# Patient Record
Sex: Female | Born: 1990 | Race: Black or African American | Hispanic: No | Marital: Single | Smoking: Never smoker
Health system: Southern US, Community
[De-identification: ages and names within clinical notes are randomized; demographics above are authoritative.]

## PROBLEM LIST (undated history)

## (undated) ENCOUNTER — Inpatient Hospital Stay (HOSPITAL_COMMUNITY): Payer: Self-pay

## (undated) DIAGNOSIS — F53 Postpartum depression: Secondary | ICD-10-CM

## (undated) DIAGNOSIS — O26643 Intrahepatic cholestasis of pregnancy, third trimester: Secondary | ICD-10-CM

## (undated) DIAGNOSIS — K831 Obstruction of bile duct: Secondary | ICD-10-CM

## (undated) DIAGNOSIS — O26613 Liver and biliary tract disorders in pregnancy, third trimester: Secondary | ICD-10-CM

## (undated) DIAGNOSIS — O99345 Other mental disorders complicating the puerperium: Secondary | ICD-10-CM

## (undated) HISTORY — DX: Liver and biliary tract disorders in pregnancy, third trimester: K83.1

## (undated) HISTORY — PX: NO PAST SURGERIES: SHX2092

## (undated) HISTORY — DX: Liver and biliary tract disorders in pregnancy, third trimester: O26.613

## (undated) HISTORY — DX: Intrahepatic cholestasis of pregnancy, third trimester: O26.643

---
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
## 2009-08-10 ENCOUNTER — Emergency Department (HOSPITAL_COMMUNITY): Admission: EM | Admit: 2009-08-10 | Discharge: 2009-08-10 | Payer: Self-pay | Admitting: Family Medicine

## 2010-07-08 LAB — URINE MICROSCOPIC-ADD ON

## 2010-07-08 LAB — URINALYSIS, ROUTINE W REFLEX MICROSCOPIC
Hgb urine dipstick: NEGATIVE
Protein, ur: NEGATIVE mg/dL
Specific Gravity, Urine: 1.026 (ref 1.005–1.030)
Urobilinogen, UA: 0.2 mg/dL (ref 0.0–1.0)

## 2010-07-08 LAB — GC/CHLAMYDIA PROBE AMP, GENITAL

## 2010-07-08 LAB — WET PREP, GENITAL
Trich, Wet Prep: NONE SEEN
Yeast Wet Prep HPF POC: NONE SEEN

## 2010-07-08 LAB — URINE CULTURE

## 2010-08-10 ENCOUNTER — Emergency Department (HOSPITAL_COMMUNITY)
Admission: EM | Admit: 2010-08-10 | Discharge: 2010-08-10 | Disposition: A | Discharge status: Home / Self Care | Payer: Self-pay | Attending: Emergency Medicine | Admitting: Emergency Medicine

## 2010-08-10 DIAGNOSIS — N898 Other specified noninflammatory disorders of vagina: Secondary | ICD-10-CM | POA: Insufficient documentation

## 2010-08-10 DIAGNOSIS — R599 Enlarged lymph nodes, unspecified: Secondary | ICD-10-CM | POA: Insufficient documentation

## 2010-08-10 DIAGNOSIS — A64 Unspecified sexually transmitted disease: Secondary | ICD-10-CM | POA: Insufficient documentation

## 2010-08-10 DIAGNOSIS — E669 Obesity, unspecified: Secondary | ICD-10-CM | POA: Insufficient documentation

## 2010-08-10 LAB — WET PREP, GENITAL: Clue Cells Wet Prep HPF POC: NONE SEEN

## 2010-08-10 LAB — URINALYSIS, ROUTINE W REFLEX MICROSCOPIC
Glucose, UA: NEGATIVE mg/dL
Ketones, ur: NEGATIVE mg/dL
Nitrite: NEGATIVE
Protein, ur: NEGATIVE mg/dL
Urobilinogen, UA: 0.2 mg/dL (ref 0.0–1.0)

## 2010-08-10 LAB — URINE MICROSCOPIC-ADD ON

## 2010-08-12 LAB — URINE CULTURE
Colony Count: 85000
Culture  Setup Time: 201205100404

## 2010-12-26 LAB — URINALYSIS, ROUTINE W REFLEX MICROSCOPIC
Bilirubin Urine: NEGATIVE
Glucose, UA: NEGATIVE
Protein, ur: NEGATIVE

## 2010-12-26 LAB — URINE CULTURE

## 2010-12-26 LAB — URINE MICROSCOPIC-ADD ON

## 2010-12-26 LAB — GC/CHLAMYDIA PROBE AMP, GENITAL

## 2010-12-26 LAB — WET PREP, GENITAL: Clue Cells Wet Prep HPF POC: NONE SEEN

## 2011-04-04 NOTE — L&D Delivery Note (Signed)
Patient was C/C/0 and pushed for 20 minutes with epidural.   NSVD  female infant, Apgars 9/9, weight pending.   The patient had one second degree laceration repaired with 2-0 vicryl. Fundus was firm. EBL was expected. Placenta was delivered intact, 2-vessel cord noted, sent to pathology. Vagina was clear.  Baby was vigorous to bedside.  Philip Aspen

## 2011-06-14 ENCOUNTER — Encounter (HOSPITAL_COMMUNITY): Payer: Self-pay | Admitting: Advanced Practice Midwife

## 2011-06-14 ENCOUNTER — Inpatient Hospital Stay (HOSPITAL_COMMUNITY): Payer: Medicaid Other

## 2011-06-14 ENCOUNTER — Inpatient Hospital Stay (HOSPITAL_COMMUNITY)
Admission: AD | Admit: 2011-06-14 | Discharge: 2011-06-14 | Disposition: A | Discharge status: Home / Self Care | Payer: Medicaid Other | Source: Ambulatory Visit | Attending: Obstetrics & Gynecology | Admitting: Obstetrics & Gynecology

## 2011-06-14 DIAGNOSIS — O26859 Spotting complicating pregnancy, unspecified trimester: Secondary | ICD-10-CM | POA: Insufficient documentation

## 2011-06-14 DIAGNOSIS — Z3490 Encounter for supervision of normal pregnancy, unspecified, unspecified trimester: Secondary | ICD-10-CM

## 2011-06-14 DIAGNOSIS — R109 Unspecified abdominal pain: Secondary | ICD-10-CM | POA: Insufficient documentation

## 2011-06-14 DIAGNOSIS — Z8619 Personal history of other infectious and parasitic diseases: Secondary | ICD-10-CM | POA: Clinically undetermined

## 2011-06-14 DIAGNOSIS — Z1389 Encounter for screening for other disorder: Secondary | ICD-10-CM

## 2011-06-14 MED ORDER — IBUPROFEN 800 MG PO TABS
800.0000 mg | ORAL_TABLET | Freq: Once | ORAL | Status: AC
  Administered 2011-06-14: 800 mg via ORAL
  Filled 2011-06-14: qty 1

## 2011-06-14 NOTE — Discharge Instructions (Signed)
ABCs of Pregnancy A Antepartum care is very important. Be sure you see your doctor and get prenatal care as soon as you think you are pregnant. At this time, you will be tested for infection, genetic abnormalities and potential problems with you and the pregnancy. This is the time to discuss diet, exercise, work, medications, labor, pain medication during labor and the possibility of a cesarean delivery. Ask any questions that may concern you. It is important to see your doctor regularly throughout your pregnancy. Avoid exposure to toxic substances and chemicals - such as cleaning solvents, lead and mercury, some insecticides, and paint. Pregnant women should avoid exposure to paint fumes, and fumes that cause you to feel ill, dizzy or faint. When possible, it is a good idea to have a pre-pregnancy consultation with your caregiver to begin some important recommendations your caregiver suggests such as, taking folic acid, exercising, quitting smoking, avoiding alcoholic beverages, etc. B Breastfeeding is the healthiest choice for both you and your baby. It has many nutritional benefits for the baby and health benefits for the mother. It also creates a very tight and loving bond between the baby and mother. Talk to your doctor, your family and friends, and your employer about how you choose to feed your baby and how they can support you in your decision. Not all birth defects can be prevented, but a woman can take actions that may increase her chance of having a healthy baby. Many birth defects happen very early in pregnancy, sometimes before a woman even knows she is pregnant. Birth defects or abnormalities of any child in your or the father's family should be discussed with your caregiver. Get a good support bra as your breast size changes. Wear it especially when you exercise and when nursing.  C Celebrate the news of your pregnancy with the your spouse/father and family. Childbirth classes are helpful to  take for you and the spouse/father because it helps to understand what happens during the pregnancy, labor and delivery. Cesarean delivery should be discussed with your doctor so you are prepared for that possibility. The pros and cons of circumcision if it is a boy, should be discussed with your pediatrician. Cigarette smoking during pregnancy can result in low birth weight babies. It has been associated with infertility, miscarriages, tubal pregnancies, infant death (mortality) and poor health (morbidity) in childhood. Additionally, cigarette smoking may cause long-term learning disabilities. If you smoke, you should try to quit before getting pregnant and not smoke during the pregnancy. Secondary smoke may also harm a mother and her developing baby. It is a good idea to ask people to stop smoking around you during your pregnancy and after the baby is born. Extra calcium is necessary when you are pregnant and is found in your prenatal vitamin, in dairy products, green leafy vegetables and in calcium supplements. D A healthy diet according to your current weight and height, along with vitamins and mineral supplements should be discussed with your caregiver. Domestic abuse or violence should be made known to your doctor right away to get the situation corrected. Drink more water when you exercise to keep hydrated. Discomfort of your back and legs usually develops and progresses from the middle of the second trimester through to delivery of the baby. This is because of the enlarging baby and uterus, which may also affect your balance. Do not take illegal drugs. Illegal drugs can seriously harm the baby and you. Drink extra fluids (water is best) throughout pregnancy to help   your body keep up with the increases in your blood volume. Drink at least 6 to 8 glasses of water, fruit juice, or milk each day. A good way to know you are drinking enough fluid is when your urine looks almost like clear water or is very light  yellow.  E Eat healthy to get the nutrients you and your unborn baby need. Your meals should include the five basic food groups. Exercise (30 minutes of light to moderate exercise a day) is important and encouraged during pregnancy, if there are no medical problems or problems with the pregnancy. Exercise that causes discomfort or dizziness should be stopped and reported to your caregiver. Emotions during pregnancy can change from being ecstatic to depression and should be understood by you, your partner and your family. F Fetal screening with ultrasound, amniocentesis and monitoring during pregnancy and labor is common and sometimes necessary. Take 400 micrograms of folic acid daily both before, when possible, and during the first few months of pregnancy to reduce the risk of birth defects of the brain and spine. All women who could possibly become pregnant should take a vitamin with folic acid, every day. It is also important to eat a healthy diet with fortified foods (enriched grain products, including cereals, rice, breads, and pastas) and foods with natural sources of folate (orange juice, green leafy vegetables, beans, peanuts, broccoli, asparagus, peas, and lentils). The father should be involved with all aspects of the pregnancy including, the prenatal care, childbirth classes, labor, delivery, and postpartum time. Fathers may also have emotional concerns about being a father, financial needs, and raising a family. G Genetic testing should be done appropriately. It is important to know your family and the father's history. If there have been problems with pregnancies or birth defects in your family, report these to your doctor. Also, genetic counselors can talk with you about the information you might need in making decisions about having a family. You can call a major medical center in your area for help in finding a board-certified genetic counselor. Genetic testing and counseling should be done  before pregnancy when possible, especially if there is a history of problems in the mother's or father's family. Certain ethnic backgrounds are more at risk for genetic defects. H Get familiar with the hospital where you will be having your baby. Get to know how long it takes to get there, the labor and delivery area, and the hospital procedures. Be sure your medical insurance is accepted there. Get your home ready for the baby including, clothes, the baby's room (when possible), furniture and car seat. Hand washing is important throughout the day, especially after handling raw meat and poultry, changing the baby's diaper or using the bathroom. This can help prevent the spread of many bacteria and viruses that cause infection. Your hair may become dry and thinner, but will return to normal a few weeks after the baby is born. Heartburn is a common problem that can be treated by taking antacids recommended by your caregiver, eating smaller meals 5 or 6 times a day, not drinking liquids when eating, drinking between meals and raising the head of your bed 2 to 3 inches. I Insurance to cover you, the baby, doctor and hospital should be reviewed so that you will be prepared to pay any costs not covered by your insurance plan. If you do not have medical insurance, there are usually clinics and services available for you in your community. Take 30 milligrams of iron during   your pregnancy as prescribed by your doctor to reduce the risk of low red blood cells (anemia) later in pregnancy. All women of childbearing age should eat a diet rich in iron. J There should be a joint effort for the mother, father and any other children to adapt to the pregnancy financially, emotionally, and psychologically during the pregnancy. Join a support group for moms-to-be. Or, join a class on parenting or childbirth. Have the family participate when possible. K Know your limits. Let your caregiver know if you experience any of the  following:   Pain of any kind.   Strong cramps.   You develop a lot of weight in a short period of time (5 pounds in 3 to 5 days).   Vaginal bleeding, leaking of amniotic fluid.   Headache, vision problems.   Dizziness, fainting, shortness of breath.   Chest pain.   Fever of 102 F (38.9 C) or higher.   Gush of clear fluid from your vagina.   Painful urination.   Domestic violence.   Irregular heartbeat (palpitations).   Rapid beating of the heart (tachycardia).   Constant feeling sick to your stomach (nauseous) and vomiting.   Trouble walking, fluid retention (edema).   Muscle weakness.   If your baby has decreased activity.   Persistent diarrhea.   Abnormal vaginal discharge.   Uterine contractions at 20-minute intervals.   Back pain that travels down your leg.  L Learn and practice that what you eat and drink should be in moderation and healthy for you and your baby. Legal drugs such as alcohol and caffeine are important issues for pregnant women. There is no safe amount of alcohol a woman can drink while pregnant. Fetal alcohol syndrome, a disorder characterized by growth retardation, facial abnormalities, and central nervous system dysfunction, is caused by a woman's use of alcohol during pregnancy. Caffeine, found in tea, coffee, soft drinks and chocolate, should also be limited. Be sure to read labels when trying to cut down on caffeine during pregnancy. More than 200 foods, beverages, and over-the-counter medications contain caffeine and have a high salt content! There are coffees and teas that do not contain caffeine. M Medical conditions such as diabetes, epilepsy, and high blood pressure should be treated and kept under control before pregnancy when possible, but especially during pregnancy. Ask your caregiver about any medications that may need to be changed or adjusted during pregnancy. If you are currently taking any medications, ask your caregiver if it  is safe to take them while you are pregnant or before getting pregnant when possible. Also, be sure to discuss any herbs or vitamins you are taking. They are medicines, too! Discuss with your doctor all medications, prescribed and over-the-counter, that you are taking. During your prenatal visit, discuss the medications your doctor may give you during labor and delivery. N Never be afraid to ask your doctor or caregiver questions about your health, the progress of the pregnancy, family problems, stressful situations, and recommendation for a pediatrician, if you do not have one. It is better to take all precautions and discuss any questions or concerns you may have during your office visits. It is a good idea to write down your questions before you visit the doctor. O Over-the-counter cough and cold remedies may contain alcohol or other ingredients that should be avoided during pregnancy. Ask your caregiver about prescription, herbs or over-the-counter medications that you are taking or may consider taking while pregnant.  P Physical activity during pregnancy can   benefit both you and your baby by lessening discomfort and fatigue, providing a sense of well-being, and increasing the likelihood of early recovery after delivery. Light to moderate exercise during pregnancy strengthens the belly (abdominal) and back muscles. This helps improve posture. Practicing yoga, walking, swimming, and cycling on a stationary bicycle are usually safe exercises for pregnant women. Avoid scuba diving, exercise at high altitudes (over 3000 feet), skiing, horseback riding, contact sports, etc. Always check with your doctor before beginning any kind of exercise, especially during pregnancy and especially if you did not exercise before getting pregnant. Q Queasiness, stomach upset and morning sickness are common during pregnancy. Eating a couple of crackers or dry toast before getting out of bed. Foods that you normally love may  make you feel sick to your stomach. You may need to substitute other nutritious foods. Eating 5 or 6 small meals a day instead of 3 large ones may make you feel better. Do not drink with your meals, drink between meals. Questions that you have should be written down and asked during your prenatal visits. R Read about and make plans to baby-proof your home. There are important tips for making your home a safer environment for your baby. Review the tips and make your home safer for you and your baby. Read food labels regarding calories, salt and fat content in the food. S Saunas, hot tubs, and steam rooms should be avoided while you are pregnant. Excessive high heat may be harmful during your pregnancy. Your caregiver will screen and examine you for sexually transmitted diseases and genetic disorders during your prenatal visits. Learn the signs of labor. Sexual relations while pregnant is safe unless there is a medical or pregnancy problem and your caregiver advises against it. T Traveling long distances should be avoided especially in the third trimester of your pregnancy. If you do have to travel out of state, be sure to take a copy of your medical records and medical insurance plan with you. You should not travel long distances without seeing your doctor first. Most airlines will not allow you to travel after 36 weeks of pregnancy. Toxoplasmosis is an infection caused by a parasite that can seriously harm an unborn baby. Avoid eating undercooked meat and handling cat litter. Be sure to wear gloves when gardening. Tingling of the hands and fingers is not unusual and is due to fluid retention. This will go away after the baby is born. U Womb (uterus) size increases during the first trimester. Your kidneys will begin to function more efficiently. This may cause you to feel the need to urinate more often. You may also leak urine when sneezing, coughing or laughing. This is due to the growing uterus pressing  against your bladder, which lies directly in front of and slightly under the uterus during the first few months of pregnancy. If you experience burning along with frequency of urination or bloody urine, be sure to tell your doctor. The size of your uterus in the third trimester may cause a problem with your balance. It is advisable to maintain good posture and avoid wearing high heels during this time. An ultrasound of your baby may be necessary during your pregnancy and is safe for you and your baby. V Vaccinations are an important concern for pregnant women. Get needed vaccines before pregnancy. Center for Disease Control (www.cdc.gov) has clear guidelines for the use of vaccines during pregnancy. Review the list, be sure to discuss it with your doctor. Prenatal vitamins are helpful   and healthy for you and the baby. Do not take extra vitamins except what is recommended. Taking too much of certain vitamins can cause overdose problems. Continuous vomiting should be reported to your caregiver. Varicose veins may appear especially if there is a family history of varicose veins. They should subside after the delivery of the baby. Support hose helps if there is leg discomfort. W Being overweight or underweight during pregnancy may cause problems. Try to get within 15 pounds of your ideal weight before pregnancy. Remember, pregnancy is not a time to be dieting! Do not stop eating or start skipping meals as your weight increases. Both you and your baby need the calories and nutrition you receive from a healthy diet. Be sure to consult with your doctor about your diet. There is a formula and diet plan available depending on whether you are overweight or underweight. Your caregiver or nutritionist can help and advise you if necessary. X Avoid X-rays. If you must have dental work or diagnostic tests, tell your dentist or physician that you are pregnant so that extra care can be taken. X-rays should only be taken when  the risks of not taking them outweigh the risk of taking them. If needed, only the minimum amount of radiation should be used. When X-rays are necessary, protective lead shields should be used to cover areas of the body that are not being X-rayed. Y Your baby loves you. Breastfeeding your baby creates a loving and very close bond between the two of you. Give your baby a healthy environment to live in while you are pregnant. Infants and children require constant care and guidance. Their health and safety should be carefully watched at all times. After the baby is born, rest or take a nap when the baby is sleeping. Z Get your ZZZs. Be sure to get plenty of rest. Resting on your side as often as possible, especially on your left side is advised. It provides the best circulation to your baby and helps reduce swelling. Try taking a nap for 30 to 45 minutes in the afternoon when possible. After the baby is born rest or take a nap when the baby is sleeping. Try elevating your feet for that amount of time when possible. It helps the circulation in your legs and helps reduce swelling.  Most information courtesy of the CDC. Document Released: 03/20/2005 Document Revised: 03/09/2011 Document Reviewed: 12/02/2008 [employer] Patient Information 2012 [employer]. 

## 2011-06-14 NOTE — MAU Provider Note (Signed)
  History     CSN: [id number]  Arrival date and time: 06/14/11 1521   First Provider Initiated Contact with Patient 06/14/11 1616  (seen by me for bedside US at 1600)    Chief Complaint  Patient presents with  . Vaginal Bleeding   HPI This is a 21 y.o. at 12+ weeks gestation who presents with c/o cramping and bleeding/spotting.  Just had Cultures and wet prep 2 weeks ago and was treated for Chlamydia.   OB History    Grav Para Term Preterm Abortions TAB SAB Ect Mult Living   1               Past Medical History  Diagnosis Date  . No pertinent past medical history     Past Surgical History  Procedure Date  . No past surgeries     Family History  Problem Relation Age of Onset  . Diabetes Maternal Uncle     History  Substance Use Topics  . Smoking status: Never Smoker   . Smokeless tobacco: Not on file  . Alcohol Use: No    Allergies: Not on File  No prescriptions prior to admission    ROS As above  Physical Exam   Blood pressure 129/69, pulse 88, temperature 97.9 F (36.6 C), temperature source Oral, resp. rate 20, height 5\' 4"  (1.626 m), weight 229 lb (103.874 kg), last menstrual period 03/18/2011, SpO2 100.00%.  Physical Exam  Constitutional: She is oriented to person, place, and time. She appears well-developed and well-nourished.  HENT:  Head: Normocephalic.  Cardiovascular: Normal rate.   Respiratory: Effort normal.  GI: Soft. She exhibits no distension and no mass. There is no tenderness. There is no rebound and no guarding.  Genitourinary: Vagina normal and uterus normal. No vaginal discharge found.       No blood at all seen. Scant white d/c. Uterus s=dates.  Musculoskeletal: Normal range of motion.  Neurological: She is alert and oriented to person, place, and time.  Skin: Skin is warm and dry.  Psychiatric: She has a normal mood and affect.   [place]:  Normal SIUP at 12.4 weeks with [facility] 12/23/11  MAU Course  Procedures  MDM Got [place] to  verify absence of bleeding issue and will defer cultures due to recent testing/treatment. Ibuprofen given for cramping. Felt better afterward.  Assessment and Plan  A:  SIUP at 12.4 weeks      No evidence of bleeding P:  Resume Prenatal care  [facility] 06/14/2011, 4:16 PM

## 2011-06-14 NOTE — Progress Notes (Signed)
Pt tearful on vaginal exam. Pt states she feel cramping

## 2011-06-14 NOTE — MAU Note (Signed)
Patient states she has had a pregnancy test at [facility]. Has had spotting last night and a little heavier today. Is wearing a tampon and unable to assess the bleeding. States she is having mild cramping.

## 2011-06-15 NOTE — MAU Provider Note (Signed)
Attestation of Attending Supervision of Advanced Practitioner: Evaluation and management procedures were performed by the PA/NP/CNM/OB Fellow under my supervision/collaboration. Chart reviewed, and agree with management and plan.  Tess Potts, M.D. 06/15/2011 9:06 AM   

## 2011-07-25 ENCOUNTER — Other Ambulatory Visit: Payer: Self-pay

## 2011-07-25 LAB — OB RESULTS CONSOLE ABO/RH: RH Type: POSITIVE

## 2011-07-25 LAB — OB RESULTS CONSOLE HIV ANTIBODY (ROUTINE TESTING): HIV: NONREACTIVE

## 2011-08-15 ENCOUNTER — Other Ambulatory Visit (HOSPITAL_COMMUNITY): Payer: Self-pay | Admitting: Obstetrics and Gynecology

## 2011-08-15 DIAGNOSIS — Z3689 Encounter for other specified antenatal screening: Secondary | ICD-10-CM

## 2011-08-15 DIAGNOSIS — O09899 Supervision of other high risk pregnancies, unspecified trimester: Secondary | ICD-10-CM

## 2011-08-16 ENCOUNTER — Encounter (HOSPITAL_COMMUNITY): Payer: Self-pay

## 2011-08-16 ENCOUNTER — Ambulatory Visit (HOSPITAL_COMMUNITY)
Admission: RE | Admit: 2011-08-16 | Discharge: 2011-08-16 | Disposition: A | Discharge status: Home / Self Care | Payer: Medicaid Other | Source: Ambulatory Visit | Attending: Obstetrics and Gynecology | Admitting: Obstetrics and Gynecology

## 2011-08-16 DIAGNOSIS — Z363 Encounter for antenatal screening for malformations: Secondary | ICD-10-CM | POA: Insufficient documentation

## 2011-08-16 DIAGNOSIS — Z3689 Encounter for other specified antenatal screening: Secondary | ICD-10-CM

## 2011-08-16 DIAGNOSIS — IMO0001 Reserved for inherently not codable concepts without codable children: Secondary | ICD-10-CM | POA: Insufficient documentation

## 2011-08-16 DIAGNOSIS — O358XX0 Maternal care for other (suspected) fetal abnormality and damage, not applicable or unspecified: Secondary | ICD-10-CM | POA: Insufficient documentation

## 2011-08-16 DIAGNOSIS — O09899 Supervision of other high risk pregnancies, unspecified trimester: Secondary | ICD-10-CM

## 2011-08-16 DIAGNOSIS — Z1389 Encounter for screening for other disorder: Secondary | ICD-10-CM | POA: Insufficient documentation

## 2011-09-29 ENCOUNTER — Encounter (HOSPITAL_COMMUNITY): Payer: Self-pay | Admitting: *Deleted

## 2011-09-29 ENCOUNTER — Inpatient Hospital Stay (HOSPITAL_COMMUNITY)
Admission: AD | Admit: 2011-09-29 | Discharge: 2011-09-29 | Disposition: A | Discharge status: Home / Self Care | Payer: Medicaid Other | Source: Ambulatory Visit | Attending: Obstetrics & Gynecology | Admitting: Obstetrics & Gynecology

## 2011-09-29 DIAGNOSIS — N898 Other specified noninflammatory disorders of vagina: Secondary | ICD-10-CM

## 2011-09-29 DIAGNOSIS — N949 Unspecified condition associated with female genital organs and menstrual cycle: Secondary | ICD-10-CM | POA: Insufficient documentation

## 2011-09-29 DIAGNOSIS — O99891 Other specified diseases and conditions complicating pregnancy: Secondary | ICD-10-CM | POA: Insufficient documentation

## 2011-09-29 LAB — WET PREP, GENITAL
Clue Cells Wet Prep HPF POC: NONE SEEN
Trich, Wet Prep: NONE SEEN

## 2011-09-29 NOTE — Discharge Instructions (Signed)

## 2011-09-29 NOTE — MAU Note (Signed)
Had gush of fluid around 0600 and 1000, fluid clear/whiteish fluid. Patient cramping over last week and bleeding noted on Tuesday, since resolved. Reports positive fetal movement

## 2011-09-29 NOTE — MAU Note (Signed)
Patient states she had a gush of clear fluid at 0600 and again at 1045. No active leaking at this time. Has been having lower abdominal pain for about one week off and on. No bleeding today but had some spotting on 6-25. Reports good fetal movement.

## 2011-09-29 NOTE — MAU Provider Note (Signed)
History     CSN: [id number]  Arrival date and time: 09/29/11 1756   First Provider Initiated Contact with Patient 09/29/11 1906      Chief Complaint  Patient presents with  . Rupture of Membranes   HPI This is a 21 yo G1 at 27.6 weeks seen for whitish vaginal discharge that occurred this morning at work.  She felt a big gush of fluid that continued to trickle down her leg.  Denies decreased fetal activity, vaginal bleeding, contractions.    OB History    Grav Para Term Preterm Abortions TAB SAB Ect Mult Living   1               Past Medical History  Diagnosis Date  . No pertinent past medical history     Past Surgical History  Procedure Date  . No past surgeries     Family History  Problem Relation Age of Onset  . Diabetes Maternal Uncle     History  Substance Use Topics  . Smoking status: Never Smoker   . Smokeless tobacco: Not on file  . Alcohol Use: Yes     occasional ETOH use prior to pregnancy    Allergies: No Known Allergies  Prescriptions prior to admission  Medication Sig Dispense Refill  . acetaminophen (TYLENOL) 500 MG tablet Take 1,000 mg by mouth every 6 (six) hours as needed. For headache or pain      . Prenatal Vit-Fe Fumarate-FA (PRENATAL MULTIVITAMIN) TABS Take 1 tablet by mouth daily.        ROS Physical Exam   Blood pressure 90/58, pulse 88, temperature 99.6 F (37.6 C), temperature source Oral, resp. rate 18, height 5' 5.5" (1.664 m), weight 110.678 kg (244 lb), last menstrual period 03/18/2011, SpO2 100.00%.  Physical Exam  Constitutional: She is oriented to person, place, and time. She appears well-developed and well-nourished.  GI: Soft. She exhibits no distension and no mass. There is no tenderness. There is no rebound and no guarding.  Genitourinary:       Normal external genitalia and vaginal mucosa.  White vaginal discharge.  No pooling.  No fluid from os with valsalva.  Neurological: She is alert and oriented to person,  place, and time.  Skin: Skin is warm and dry.  Psychiatric: She has a normal mood and affect. Her behavior is normal. Judgment and thought content normal.   Fern: neg Results for orders placed during the hospital encounter of 09/29/11 (from the past 24 hour(s))  WET PREP, GENITAL     Status: Abnormal   Collection Time   09/29/11  7:10 PM      Component Value Range   Yeast Wet Prep HPF POC NONE SEEN  NONE SEEN   Trich, Wet Prep NONE SEEN  NONE SEEN   Clue Cells Wet Prep HPF POC NONE SEEN  NONE SEEN   WBC, Wet Prep HPF POC FEW (*) NONE SEEN  AMNISURE RUPTURE OF MEMBRANE (ROM)     Status: Normal   Collection Time   09/29/11  7:10 PM      Component Value Range   Amnisure ROM NEGATIVE       MAU Course  Procedures NST: category 1 tracing - appropriate tracing for gestational age.  MDM Pooling, ferning, amnisure negative.  No BV, yeast, or trich.   Assessment and Plan  1.  Vag discharge  Will discharge to home as no evidence on ROM or vaginal infection.  Patient to return if has more  fluid loss.  Lilburn Straw JEHIEL 09/29/2011, 7:21 PM

## 2011-09-30 LAB — GC/CHLAMYDIA PROBE AMP, GENITAL: GC Probe Amp, Genital: NEGATIVE

## 2011-11-04 LAB — OB RESULTS CONSOLE GBS: GBS: NEGATIVE

## 2011-11-08 ENCOUNTER — Inpatient Hospital Stay (HOSPITAL_COMMUNITY): Payer: Medicaid Other

## 2011-11-08 ENCOUNTER — Inpatient Hospital Stay (HOSPITAL_COMMUNITY)
Admission: AD | Admit: 2011-11-08 | Discharge: 2011-11-08 | Disposition: A | Discharge status: Home / Self Care | Payer: Medicaid Other | Source: Ambulatory Visit | Attending: Obstetrics and Gynecology | Admitting: Obstetrics and Gynecology

## 2011-11-08 ENCOUNTER — Encounter (HOSPITAL_COMMUNITY): Payer: Self-pay | Admitting: *Deleted

## 2011-11-08 DIAGNOSIS — O368190 Decreased fetal movements, unspecified trimester, not applicable or unspecified: Secondary | ICD-10-CM | POA: Insufficient documentation

## 2011-11-08 NOTE — MAU Note (Signed)
Pt presents for decreased fetal movement today.  She states waking up with movement at 0100 this morning, but has not felt anything since then.

## 2011-11-08 NOTE — MAU Note (Signed)
Pt states for past week she's had decreased fm,  Only feels good mvmt when she drinks something very sweet. Denies bleeding, notes vaginal discharge that looks like "egg whites".

## 2011-12-01 ENCOUNTER — Observation Stay (HOSPITAL_COMMUNITY): Payer: Medicaid Other

## 2011-12-01 ENCOUNTER — Encounter (HOSPITAL_COMMUNITY): Payer: Self-pay | Admitting: *Deleted

## 2011-12-01 ENCOUNTER — Observation Stay (HOSPITAL_COMMUNITY)
Admission: AD | Admit: 2011-12-01 | Discharge: 2011-12-01 | Disposition: A | Discharge status: Home / Self Care | Payer: Medicaid Other | Source: Ambulatory Visit | Attending: Obstetrics and Gynecology | Admitting: Obstetrics and Gynecology

## 2011-12-01 ENCOUNTER — Inpatient Hospital Stay (HOSPITAL_COMMUNITY): Payer: Medicaid Other

## 2011-12-01 DIAGNOSIS — O4700 False labor before 37 completed weeks of gestation, unspecified trimester: Principal | ICD-10-CM | POA: Insufficient documentation

## 2011-12-01 DIAGNOSIS — O368390 Maternal care for abnormalities of the fetal heart rate or rhythm, unspecified trimester, not applicable or unspecified: Secondary | ICD-10-CM | POA: Insufficient documentation

## 2011-12-01 DIAGNOSIS — O2390 Unspecified genitourinary tract infection in pregnancy, unspecified trimester: Secondary | ICD-10-CM | POA: Insufficient documentation

## 2011-12-01 DIAGNOSIS — Z3490 Encounter for supervision of normal pregnancy, unspecified, unspecified trimester: Secondary | ICD-10-CM

## 2011-12-01 DIAGNOSIS — N390 Urinary tract infection, site not specified: Secondary | ICD-10-CM | POA: Insufficient documentation

## 2011-12-01 LAB — URINALYSIS, ROUTINE W REFLEX MICROSCOPIC
Glucose, UA: NEGATIVE mg/dL
Ketones, ur: NEGATIVE mg/dL
Protein, ur: NEGATIVE mg/dL
Urobilinogen, UA: 0.2 mg/dL (ref 0.0–1.0)

## 2011-12-01 LAB — CBC
MCH: 31.1 pg (ref 26.0–34.0)
MCHC: 33.8 g/dL (ref 30.0–36.0)
Platelets: 216 10*3/uL (ref 150–400)

## 2011-12-01 LAB — URINE MICROSCOPIC-ADD ON

## 2011-12-01 LAB — TYPE AND SCREEN
ABO/RH(D): O POS
Antibody Screen: NEGATIVE

## 2011-12-01 LAB — ABO/RH: ABO/RH(D): O POS

## 2011-12-01 MED ORDER — PRENATAL MULTIVITAMIN CH
1.0000 | ORAL_TABLET | Freq: Every day | ORAL | Status: DC
  Administered 2011-12-01: 1 via ORAL
  Filled 2011-12-01: qty 1

## 2011-12-01 MED ORDER — ZOLPIDEM TARTRATE 5 MG PO TABS: 5.0000 mg | ORAL_TABLET | Freq: Every evening | ORAL | Status: DC | PRN

## 2011-12-01 MED ORDER — ACETAMINOPHEN 325 MG PO TABS: 650.0000 mg | ORAL_TABLET | ORAL | Status: DC | PRN

## 2011-12-01 MED ORDER — DOCUSATE SODIUM 100 MG PO CAPS
100.0000 mg | ORAL_CAPSULE | Freq: Every day | ORAL | Status: DC
  Administered 2011-12-01: 100 mg via ORAL
  Filled 2011-12-01: qty 1

## 2011-12-01 MED ORDER — CALCIUM CARBONATE ANTACID 500 MG PO CHEW: 2.0000 | CHEWABLE_TABLET | ORAL | Status: DC | PRN

## 2011-12-01 MED ORDER — NITROFURANTOIN MONOHYD MACRO 100 MG PO CAPS: 100.0000 mg | ORAL_CAPSULE | Freq: Two times a day (BID) | ORAL | Status: AC

## 2011-12-01 MED ORDER — NITROFURANTOIN MONOHYD MACRO 100 MG PO CAPS
100.0000 mg | ORAL_CAPSULE | Freq: Two times a day (BID) | ORAL | Status: DC
  Administered 2011-12-01: 100 mg via ORAL
  Filled 2011-12-01 (×3): qty 1

## 2011-12-01 MED ORDER — OXYCODONE-ACETAMINOPHEN 5-325 MG PO TABS: 2.0000 | ORAL_TABLET | Freq: Once | ORAL | Status: DC
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
## 2011-12-01 NOTE — Progress Notes (Signed)
21 y.o. G1P0 [redacted]w[redacted]d HD#0 admitted for 36 wks, ctxs, BPP 6/10 at 0330 this am.  Pt currently stable with no c/o lof or bleeding.  Good FM.  Filed Vitals:   12/01/11 0050 12/01/11 0442 12/01/11 0651 12/01/11 0748  BP: 122/68 118/65  110/62  Pulse:  82  93  Temp: 97.9 F (36.6 C) 97.8 F (36.6 C)  97.8 F (36.6 C)  TempSrc: Oral Oral  Oral  Resp: 20 20 18 20   Height: 5\' 5"  (1.651 m)     Weight: 113.626 kg (250 lb 8 oz)       Lungs CTA Cor RRR Abd  Soft, gravid, nontender Ex SCDs FHTs  120s, good short term variability, NST R Toco  occ  Results for orders placed during the hospital encounter of 12/01/11 (from the past 24 hour(s))  URINALYSIS, ROUTINE W REFLEX MICROSCOPIC     Status: Abnormal   Collection Time   12/01/11  1:50 AM      Component Value Range   Color, Urine YELLOW  YELLOW   APPearance CLEAR  CLEAR   Specific Gravity, Urine 1.010  1.005 - 1.030   pH 7.0  5.0 - 8.0   Glucose, UA NEGATIVE  NEGATIVE mg/dL   Hgb urine dipstick NEGATIVE  NEGATIVE   Bilirubin Urine NEGATIVE  NEGATIVE   Ketones, ur NEGATIVE  NEGATIVE mg/dL   Protein, ur NEGATIVE  NEGATIVE mg/dL   Urobilinogen, UA 0.2  0.0 - 1.0 mg/dL   Nitrite NEGATIVE  NEGATIVE   Leukocytes, UA MODERATE (*) NEGATIVE  URINE MICROSCOPIC-ADD ON     Status: Abnormal   Collection Time   12/01/11  1:50 AM      Component Value Range   Squamous Epithelial / LPF MANY (*) RARE   WBC, UA 7-10  <3 WBC/hpf   Bacteria, UA RARE  RARE   Urine-Other MUCOUS PRESENT    TYPE AND SCREEN     Status: Normal   Collection Time   12/01/11  5:05 AM      Component Value Range   ABO/RH(D) O POS     Antibody Screen NEG     Sample Expiration 12/04/2011    CBC     Status: Abnormal   Collection Time   12/01/11  5:05 AM      Component Value Range   WBC 7.4  4.0 - 10.5 K/uL   RBC 3.83 (*) 3.87 - 5.11 MIL/uL   Hemoglobin 11.9 (*) 12.0 - 15.0 g/dL   HCT 16.1 (*) 09.6 - 04.5 %   MCV 91.9  78.0 - 100.0 fL   MCH 31.1  26.0 - 34.0 pg   MCHC  33.8  30.0 - 36.0 g/dL   RDW 40.9  81.1 - 91.4 %   Platelets 216  150 - 400 K/uL  ABO/RH     Status: Normal   Collection Time   12/01/11  5:05 AM      Component Value Range   ABO/RH(D) O POS      A:  HD#0  [redacted]w[redacted]d with BPP 6/10.  FHTs now more reassuring.  AFI was 22 cm.  P: If BPP 8/10 today and no other signs of labor, will d/c to home with kick counts.  UTI- Rx with macrobid.  Kristin [name] A

## 2011-12-01 NOTE — H&P (Signed)
  S: Pt was seen in MAU for r/o labor.  While in MAU fetal tracing was noted to be reassuring but not reactive.  A few variables were also noted.  The patients pregnancy has been complicated by [facility].  Given these findings a BPP was performed. BPP was 6/8, -2 for non-sustained fetal breathing movements.  The patient was admitted for obs due to 6/8 BPP. O: AFVSS FHT 140 reassiring cvx 1/th/-2 toco irregular A/P: 21 yo G1P0 @ 36+6 with 6/8 BPP 1) Admit for obs 2) Continuous monitoring 3) repeat BPP later this morning. If BPP 8/8 then D/C home

## 2011-12-01 NOTE — Progress Notes (Signed)
UR Chart review completed.  

## 2011-12-01 NOTE — Progress Notes (Signed)
Dr. Tenny Craw notified of pt status. Plan of care d/w Dr. Tenny Craw. UA sent . Pt to be rechecked in one hour. If no cervical change, and UA ins WNL pt may be discharged home w/ 2 percocet for her back pain.

## 2011-12-01 NOTE — MAU Note (Signed)
Pt reports pain in back since 2300

## 2011-12-19 ENCOUNTER — Encounter (HOSPITAL_COMMUNITY): Payer: Self-pay | Admitting: Anesthesiology

## 2011-12-19 ENCOUNTER — Inpatient Hospital Stay (HOSPITAL_COMMUNITY): Payer: Medicaid Other | Admitting: Anesthesiology

## 2011-12-19 ENCOUNTER — Inpatient Hospital Stay (HOSPITAL_COMMUNITY)
Admission: RE | Admit: 2011-12-19 | Discharge: 2011-12-21 | DRG: 775 | Disposition: A | Discharge status: Home / Self Care | Payer: Medicaid Other | Source: Ambulatory Visit | Attending: Obstetrics and Gynecology | Admitting: Obstetrics and Gynecology

## 2011-12-19 ENCOUNTER — Encounter (HOSPITAL_COMMUNITY): Payer: Self-pay

## 2011-12-19 LAB — CBC
MCH: 30.9 pg (ref 26.0–34.0)
MCHC: 33.9 g/dL (ref 30.0–36.0)
Platelets: 238 10*3/uL (ref 150–400)
RDW: 14.2 % (ref 11.5–15.5)

## 2011-12-19 LAB — RPR: RPR Ser Ql: NONREACTIVE

## 2011-12-19 LAB — TYPE AND SCREEN
ABO/RH(D): O POS
Antibody Screen: NEGATIVE

## 2011-12-19 MED ORDER — LACTATED RINGERS IV SOLN
500.0000 mL | INTRAVENOUS | Status: DC | PRN
Start: 2011-12-19 — End: 2011-12-19

## 2011-12-19 MED ORDER — LANOLIN HYDROUS EX OINT: TOPICAL_OINTMENT | CUTANEOUS | Status: DC | PRN

## 2011-12-19 MED ORDER — PRENATAL MULTIVITAMIN CH: 1.0000 | ORAL_TABLET | Freq: Every day | ORAL | Status: DC

## 2011-12-19 MED ORDER — TETANUS-DIPHTH-ACELL PERTUSSIS 5-2.5-18.5 LF-MCG/0.5 IM SUSP
0.5000 mL | Freq: Once | INTRAMUSCULAR | Status: AC
  Administered 2011-12-20: 0.5 mL via INTRAMUSCULAR

## 2011-12-19 MED ORDER — SIMETHICONE 80 MG PO CHEW: 80.0000 mg | CHEWABLE_TABLET | ORAL | Status: DC | PRN

## 2011-12-19 MED ORDER — OXYTOCIN 40 UNITS IN LACTATED RINGERS INFUSION - SIMPLE MED
1.0000 m[IU]/min | INTRAVENOUS | Status: DC
  Administered 2011-12-19: 2 m[IU]/min via INTRAVENOUS
  Filled 2011-12-19: qty 1000

## 2011-12-19 MED ORDER — ZOLPIDEM TARTRATE 5 MG PO TABS: 5.0000 mg | ORAL_TABLET | Freq: Every evening | ORAL | Status: DC | PRN

## 2011-12-19 MED ORDER — FENTANYL 2.5 MCG/ML BUPIVACAINE 1/10 % EPIDURAL INFUSION (WH - ANES)
14.0000 mL/h | INTRAMUSCULAR | Status: DC
  Administered 2011-12-19: 14 mL/h via EPIDURAL
  Filled 2011-12-19 (×2): qty 60

## 2011-12-19 MED ORDER — BENZOCAINE-MENTHOL 20-0.5 % EX AERO
1.0000 "application " | INHALATION_SPRAY | CUTANEOUS | Status: DC | PRN
  Administered 2011-12-20: 1 via TOPICAL
  Filled 2011-12-19: qty 56

## 2011-12-19 MED ORDER — ONDANSETRON HCL 4 MG/2ML IJ SOLN: 4.0000 mg | INTRAMUSCULAR | Status: DC | PRN

## 2011-12-19 MED ORDER — DIBUCAINE 1 % RE OINT: 1.0000 "application " | TOPICAL_OINTMENT | RECTAL | Status: DC | PRN

## 2011-12-19 MED ORDER — OXYTOCIN BOLUS FROM INFUSION
500.0000 mL | Freq: Once | INTRAVENOUS | Status: AC
  Administered 2011-12-19: 500 mL via INTRAVENOUS
  Filled 2011-12-19: qty 500

## 2011-12-19 MED ORDER — IBUPROFEN 600 MG PO TABS
600.0000 mg | ORAL_TABLET | Freq: Four times a day (QID) | ORAL | Status: DC
  Administered 2011-12-20 – 2011-12-21 (×6): 600 mg via ORAL
  Filled 2011-12-19 (×6): qty 1

## 2011-12-19 MED ORDER — SODIUM BICARBONATE 8.4 % IV SOLN
INTRAVENOUS | Status: DC | PRN
  Administered 2011-12-19: 4 mL via EPIDURAL

## 2011-12-19 MED ORDER — OXYCODONE-ACETAMINOPHEN 5-325 MG PO TABS: 1.0000 | ORAL_TABLET | ORAL | Status: DC | PRN

## 2011-12-19 MED ORDER — LIDOCAINE HCL (PF) 1 % IJ SOLN
30.0000 mL | INTRAMUSCULAR | Status: DC | PRN
  Filled 2011-12-19: qty 30

## 2011-12-19 MED ORDER — ACETAMINOPHEN 325 MG PO TABS
650.0000 mg | ORAL_TABLET | ORAL | Status: DC | PRN
  Administered 2011-12-19: 650 mg via ORAL
  Filled 2011-12-19: qty 2

## 2011-12-19 MED ORDER — LACTATED RINGERS IV SOLN
INTRAVENOUS | Status: DC
  Administered 2011-12-19 (×5): via INTRAVENOUS

## 2011-12-19 MED ORDER — EPHEDRINE 5 MG/ML INJ
10.0000 mg | INTRAVENOUS | Status: DC | PRN
  Filled 2011-12-19: qty 4

## 2011-12-19 MED ORDER — LACTATED RINGERS IV SOLN: INTRAVENOUS | Status: DC

## 2011-12-19 MED ORDER — ONDANSETRON HCL 4 MG/2ML IJ SOLN: 4.0000 mg | Freq: Four times a day (QID) | INTRAMUSCULAR | Status: DC | PRN

## 2011-12-19 MED ORDER — EPHEDRINE 5 MG/ML INJ: 10.0000 mg | INTRAVENOUS | Status: DC | PRN

## 2011-12-19 MED ORDER — FLEET ENEMA 7-19 GM/118ML RE ENEM: 1.0000 | ENEMA | RECTAL | Status: DC | PRN

## 2011-12-19 MED ORDER — DIPHENHYDRAMINE HCL 50 MG/ML IJ SOLN: 12.5000 mg | INTRAMUSCULAR | Status: DC | PRN

## 2011-12-19 MED ORDER — PHENYLEPHRINE 40 MCG/ML (10ML) SYRINGE FOR IV PUSH (FOR BLOOD PRESSURE SUPPORT): 80.0000 ug | PREFILLED_SYRINGE | INTRAVENOUS | Status: DC | PRN

## 2011-12-19 MED ORDER — CITRIC ACID-SODIUM CITRATE 334-500 MG/5ML PO SOLN: 30.0000 mL | ORAL | Status: DC | PRN

## 2011-12-19 MED ORDER — PRENATAL MULTIVITAMIN CH
1.0000 | ORAL_TABLET | Freq: Every day | ORAL | Status: DC
  Administered 2011-12-19 – 2011-12-21 (×3): 1 via ORAL
  Filled 2011-12-19 (×3): qty 1

## 2011-12-19 MED ORDER — SENNOSIDES-DOCUSATE SODIUM 8.6-50 MG PO TABS
2.0000 | ORAL_TABLET | Freq: Every day | ORAL | Status: DC
  Administered 2011-12-19 – 2011-12-20 (×2): 2 via ORAL

## 2011-12-19 MED ORDER — DIPHENHYDRAMINE HCL 25 MG PO CAPS: 25.0000 mg | ORAL_CAPSULE | Freq: Four times a day (QID) | ORAL | Status: DC | PRN

## 2011-12-19 MED ORDER — FENTANYL 2.5 MCG/ML BUPIVACAINE 1/10 % EPIDURAL INFUSION (WH - ANES)
INTRAMUSCULAR | Status: DC | PRN
  Administered 2011-12-19: 14 mL/h via EPIDURAL

## 2011-12-19 MED ORDER — PHENYLEPHRINE 40 MCG/ML (10ML) SYRINGE FOR IV PUSH (FOR BLOOD PRESSURE SUPPORT)
80.0000 ug | PREFILLED_SYRINGE | INTRAVENOUS | Status: DC | PRN
  Filled 2011-12-19: qty 5

## 2011-12-19 MED ORDER — OXYTOCIN 40 UNITS IN LACTATED RINGERS INFUSION - SIMPLE MED: 62.5000 mL/h | Freq: Once | INTRAVENOUS | Status: DC

## 2011-12-19 MED ORDER — ONDANSETRON HCL 4 MG PO TABS: 4.0000 mg | ORAL_TABLET | ORAL | Status: DC | PRN

## 2011-12-19 MED ORDER — LACTATED RINGERS IV SOLN: 500.0000 mL | Freq: Once | INTRAVENOUS | Status: DC

## 2011-12-19 MED ORDER — WITCH HAZEL-GLYCERIN EX PADS: 1.0000 "application " | MEDICATED_PAD | CUTANEOUS | Status: DC | PRN

## 2011-12-19 MED ORDER — OXYCODONE-ACETAMINOPHEN 5-325 MG PO TABS
1.0000 | ORAL_TABLET | ORAL | Status: DC | PRN
  Administered 2011-12-21 (×2): 1 via ORAL
  Filled 2011-12-19 (×2): qty 1

## 2011-12-19 MED ORDER — IBUPROFEN 600 MG PO TABS: 600.0000 mg | ORAL_TABLET | Freq: Four times a day (QID) | ORAL | Status: DC | PRN

## 2011-12-19 MED ORDER — TERBUTALINE SULFATE 1 MG/ML IJ SOLN: 0.2500 mg | Freq: Once | INTRAMUSCULAR | Status: DC | PRN

## 2011-12-19 NOTE — Anesthesia Preprocedure Evaluation (Signed)

## 2011-12-19 NOTE — Anesthesia Procedure Notes (Signed)
Epidural Patient location during procedure: OB  Preanesthetic Checklist Completed: patient identified, site marked, surgical consent, pre-op evaluation, timeout performed, IV checked, risks and benefits discussed and monitors and equipment checked  Epidural Patient position: sitting Prep: site prepped and draped and DuraPrep Patient monitoring: continuous pulse ox and blood pressure Approach: midline Injection technique: LOR air  Needle:  Needle type: Tuohy  Needle gauge: 17 G Needle length: 9 cm and 9 Needle insertion depth: 8 cm Catheter type: closed end flexible Catheter size: 19 Gauge Catheter at skin depth: 15 cm Test dose: negative  Assessment Events: blood not aspirated, injection not painful, no injection resistance, negative IV test and no paresthesia  Additional Notes Dosing of Epidural:  1st dose, through needle ............................................. epi 1:200K + Xylocaine 40 mg  2nd dose, through catheter, after waiting 3 minutes.....epi 1:200K + Xylocaine 40 mg  3rd dose, through catheter after waiting 3 minutes .............................Marcaine   4mg   ( mg Marcaine are expressed as equivilent  cc's medication removed from the 0.1%Bupiv / fentanyl syringe from L&D pump)  ( 2% Xylo charted as a single dose in Epic Meds for ease of charting; actual dosing was fractionated as above, for saftey's sake)  As each dose occurred, patient was free of IV sx; and patient exhibited no evidence of SA injection.  Patient is more comfortable after epidural dosed. Please see RN's note for documentation of vital signs,and FHR which are stable.  Patient reminded not to try to ambulate with numb legs, and that an RN must be present the 1st time she attempts to get up.    

## 2011-12-19 NOTE — Progress Notes (Signed)
Pt comfortable s/p epidural. FHT 120 +accels, early variables TOCO q2-3 SVE 6/90/-1 AROM clear Cont current mgmt

## 2011-12-19 NOTE — H&P (Signed)
21 y.o. [redacted]w[redacted]d  G1P0000 comes in for scheduled IOL for 2VC and EFW 13% with AC<2.3%, nl AFI.  Otherwise has good fetal movement and no bleeding.  Past Medical History  Diagnosis Date  . No pertinent past medical history     Past Surgical History  Procedure Date  . No past surgeries     OB History    Grav Para Term Preterm Abortions TAB SAB Ect Mult Living   1 0 0 0 0 0 0 0 0 0      # Outc Date GA Lbr Len/2nd Wgt Sex Del Anes PTL Lv   1 CUR               History   Social History  . Marital Status: Single    Spouse Name: N/A    Number of Children: N/A  . Years of Education: N/A   Occupational History  . Not on file.   Social History Main Topics  . Smoking status: Never Smoker   . Smokeless tobacco: Not on file  . Alcohol Use: Yes     occasional ETOH use prior to pregnancy  . Drug Use: No  . Sexually Active: Yes   Other Topics Concern  . Not on file   Social History Narrative  . No narrative on file   Review of patient's allergies indicates no known allergies.   Prenatal Course:  Uncomplicated except 2VC, nl fetal ECHO  Filed Vitals:   12/19/11 1031  BP: 107/63  Pulse: 78  Temp:   Resp: 20     Lungs/Cor:  NAD Abdomen:  soft, gravid Ex:  no cords, erythema SVE:  4/70 FHTs:  140, good STV, NST R Toco:  q2-6 at admission   A/P   Admit for term IOL Pitocin 2x2 Epidural when desired Other routine care  GBS Neg  Brynlea Spindler

## 2011-12-20 LAB — CBC
Hemoglobin: 10.7 g/dL — ABNORMAL LOW (ref 12.0–15.0)
Platelets: 190 10*3/uL (ref 150–400)
RBC: 3.46 MIL/uL — ABNORMAL LOW (ref 3.87–5.11)
WBC: 9.8 10*3/uL (ref 4.0–10.5)

## 2011-12-20 MED ORDER — INFLUENZA VIRUS VACC SPLIT PF IM SUSP
0.5000 mL | Freq: Once | INTRAMUSCULAR | Status: AC
  Administered 2011-12-20: 0.5 mL via INTRAMUSCULAR
  Filled 2011-12-20: qty 0.5

## 2011-12-20 NOTE — Anesthesia Postprocedure Evaluation (Signed)
  Anesthesia Post Note  Patient: Kristin [name]  Procedure(s) Performed: * No procedures listed *  Anesthesia type: Epidural  Patient location: Mother/Baby  Post pain: Pain level controlled  Post assessment: Post-op Vital signs reviewed  Last Vitals:  Filed Vitals:   12/20/11 0535  BP: 108/73  Pulse: 88  Temp: 36.5 C  Resp: 20    Post vital signs: Reviewed  Level of consciousness:alert  Complications: No apparent anesthesia complications

## 2011-12-20 NOTE — Progress Notes (Signed)
PPD#1 Pt and baby are doing well. Lochia-mild. VSSAF IMP/ Stable Plan/ Will discharge in am.

## 2011-12-21 NOTE — Progress Notes (Signed)
Patient is eating, ambulating, voiding.  Pain control is good.  Filed Vitals:   12/20/11 1030 12/20/11 1416 12/20/11 2153 12/21/11 0543  BP: 127/83 118/76 103/68 117/72  Pulse: 106 87 103 97  Temp: 98.5 F (36.9 C) 98 F (36.7 C) 98.2 F (36.8 C) 98 F (36.7 C)  TempSrc: Oral Oral Oral Oral  Resp: 18 18 18 18   Height:      Weight:      SpO2: 100%       Fundus firm Perineum without swelling.  Lab Results  Component Value Date   WBC 9.8 12/20/2011   HGB 10.7* 12/20/2011   HCT 31.6* 12/20/2011   MCV 91.3 12/20/2011   PLT 190 12/20/2011    --/--/O POS (09/17 0750)/RI  [name]/P Post partum day 2.  Routine care.  Expect d/c today.    Kristin [name]
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
## 2011-12-21 NOTE — Progress Notes (Signed)
UR Chart review completed.  

## 2011-12-27 ENCOUNTER — Ambulatory Visit (HOSPITAL_COMMUNITY)
Admission: RE | Admit: 2011-12-27 | Discharge: 2011-12-27 | Disposition: A | Discharge status: Home / Self Care | Payer: Medicaid Other | Source: Ambulatory Visit | Attending: Obstetrics and Gynecology | Admitting: Obstetrics and Gynecology

## 2011-12-27 NOTE — Progress Notes (Signed)
Infant Lactation Consultation Outpatient Visit Note  Patient Name: Kristin [name]    Baby's name: [name] Date of Birth: 1990-12-16    DOB: [birth year]; BW: 2620g (5# 12.4 oz) Birth Weight:        D/c weight (12-21-11): 5# 8.7 oz Gestational Age at Delivery:     Weight at peds office (12-22-11: 5# 4 oz        Weight at peds office (12-25-11: 5# 12 oz)        Weight today: 2642g (5# 13.2oz)   Breastfeeding History Frequency of Breastfeeding: Not going to the breast since formula was introduced on 9-20 Length of Feeding:  Voids: q2-3 hrs, light yellow Stools: 3 yesterday, brown/mushy (Mom has never seen yellow, seedy poops)  Supplementing / Method: Pumping:  Type of Pump: Nurture 3 electric or Medela hand-pump   Frequency: q2-3 hrs for 30 min  Volume: R breast: 2 oz; L breast: scant amount  Comments: Mom concerned that so little comes out of L breast Mom offers 2 oz of Enfamil Enfacare q2-3 hrs, but baby will often only take 1 oz of formula.  Baby very willingly will consume 2 oz of EBM.   Consultation Evaluation:  Initial Feeding Assessment: Pre-feed Weight: 2642g  Post-feed Weight: 2670g Amount Transferred: 28 ml Comments: L breast (football), for 25 min  Additional Feeding Assessment: Pre-feed Weight: 2670g Post-feed Weight: 2694g Amount Transferred: 24 mL Comments: R breast (cross-cradle), for 15 min  Total Breast milk Transferred this Visit: 52 mL  Total Supplement Given: 0  Follow-Up Peds requested that formula be begun on 12-22-11 when baby's weight had fallen to 5# 4 oz (almost 10% weight loss).  Baby was back to birth weight as of this Monday (12-25-11).  Mom is interested in having baby return to breast, but baby has not wanted to feed at the breast since bottle feeding was introduced.    Baby went to the L breast with ease and w/Mom needing minimal assistance.  Mom had a "hardness" on her L breast, but this went away w/massage and feeding at the breast.  Mom needed  guidance on hand placement (she was placing hand too close to baby's mouth). Mom concerned about the size of her breasts in regards to baby's ability to breathe.  Mom reassured, educated.  After baby was weighed, baby went to R breast with ease.  Baby fed well and voided clear amounts of urine after the feeding.     Based on how baby feeds & amount of milk transferred (and baby's volume requirements) Mom likely no longer needs to give formula or to pump for supplementation purposes.  Mom can feed directly at the breast and pump for comfort if she becomes uncomfortably full.  Pacifier use discouraged at this time so that feeding opportunities are not missed.  Mom taught signs of satiety.  [facility] to f/u w/Mom on Tues, Oct 1st.  Her next peds visit will be 01-17-12.      [name] [facility] 12/27/2011, 3:43 PM

## 2012-12-21 ENCOUNTER — Encounter (HOSPITAL_COMMUNITY): Payer: Self-pay | Admitting: *Deleted

## 2012-12-21 ENCOUNTER — Emergency Department (HOSPITAL_COMMUNITY)
Admission: EM | Admit: 2012-12-21 | Discharge: 2012-12-21 | Disposition: A | Discharge status: Home / Self Care | Payer: No Typology Code available for payment source | Attending: Emergency Medicine | Admitting: Emergency Medicine

## 2012-12-21 ENCOUNTER — Emergency Department (HOSPITAL_COMMUNITY): Payer: No Typology Code available for payment source

## 2012-12-21 DIAGNOSIS — Y9389 Activity, other specified: Secondary | ICD-10-CM | POA: Insufficient documentation

## 2012-12-21 DIAGNOSIS — S3981XA Other specified injuries of abdomen, initial encounter: Secondary | ICD-10-CM | POA: Insufficient documentation

## 2012-12-21 DIAGNOSIS — O99891 Other specified diseases and conditions complicating pregnancy: Secondary | ICD-10-CM | POA: Insufficient documentation

## 2012-12-21 DIAGNOSIS — R109 Unspecified abdominal pain: Secondary | ICD-10-CM

## 2012-12-21 DIAGNOSIS — Y92410 Unspecified street and highway as the place of occurrence of the external cause: Secondary | ICD-10-CM | POA: Insufficient documentation

## 2012-12-21 DIAGNOSIS — S0990XA Unspecified injury of head, initial encounter: Secondary | ICD-10-CM | POA: Insufficient documentation

## 2012-12-21 LAB — URINALYSIS, ROUTINE W REFLEX MICROSCOPIC
Bilirubin Urine: NEGATIVE
Glucose, UA: NEGATIVE mg/dL
Hgb urine dipstick: NEGATIVE
Ketones, ur: NEGATIVE mg/dL
Nitrite: NEGATIVE
Specific Gravity, Urine: 1.016 (ref 1.005–1.030)
pH: 8 (ref 5.0–8.0)

## 2012-12-21 LAB — HCG, QUANTITATIVE, PREGNANCY: hCG, Beta Chain, Quant, S: 73303 m[IU]/mL — ABNORMAL HIGH (ref <5)

## 2012-12-21 LAB — POCT PREGNANCY, URINE: Preg Test, Ur: POSITIVE — AB

## 2012-12-21 LAB — CBC WITH DIFFERENTIAL/PLATELET
Basophils Relative: 0 % (ref 0–1)
Eosinophils Absolute: 0 10*3/uL (ref 0.0–0.7)
Eosinophils Relative: 0 % (ref 0–5)
Hemoglobin: 12.2 g/dL (ref 12.0–15.0)
Lymphs Abs: 1.6 10*3/uL (ref 0.7–4.0)
MCH: 30.9 pg (ref 26.0–34.0)
MCHC: 35.4 g/dL (ref 30.0–36.0)
MCV: 87.3 fL (ref 78.0–100.0)
Monocytes Absolute: 0.7 10*3/uL (ref 0.1–1.0)
Monocytes Relative: 8 % (ref 3–12)
Neutrophils Relative %: 74 % (ref 43–77)
RBC: 3.95 MIL/uL (ref 3.87–5.11)
WBC: 8.7 10*3/uL (ref 4.0–10.5)

## 2012-12-21 LAB — BASIC METABOLIC PANEL
BUN: 6 mg/dL (ref 6–23)
Calcium: 9.3 mg/dL (ref 8.4–10.5)
Creatinine, Ser: 0.64 mg/dL (ref 0.50–1.10)
GFR calc Af Amer: >90 mL/min (ref >90)
GFR calc non Af Amer: >90 mL/min (ref >90)
Glucose, Bld: 84 mg/dL (ref 70–99)
Potassium: 3.7 mEq/L (ref 3.5–5.1)

## 2012-12-21 LAB — TYPE AND SCREEN

## 2012-12-21 LAB — URINE MICROSCOPIC-ADD ON

## 2012-12-21 MED ORDER — SODIUM CHLORIDE 0.9 % IV SOLN: Freq: Once | INTRAVENOUS | Status: DC

## 2012-12-21 MED ORDER — MORPHINE SULFATE 4 MG/ML IJ SOLN
4.0000 mg | Freq: Once | INTRAMUSCULAR | Status: AC
  Administered 2012-12-21: 4 mg via INTRAVENOUS
  Filled 2012-12-21: qty 1

## 2012-12-21 MED ORDER — ACETAMINOPHEN 325 MG PO TABS: 650.0000 mg | ORAL_TABLET | Freq: Four times a day (QID) | ORAL | Status: DC | PRN
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
## 2012-12-21 NOTE — ED Notes (Signed)
Pt remains in CT

## 2012-12-24 LAB — URINE CULTURE: Colony Count: >=100000

## 2012-12-28 IMAGING — US US OB LIMITED
1 series · 14 of 23 positions shown · non-contrast
Comparison: none

CLINICAL DATA: Nonreactive NST

[Series 1: us fetal bpp w/o nonstress · non-contrast · 23 acquisitions, 14 frames shown]
[im 1/23]
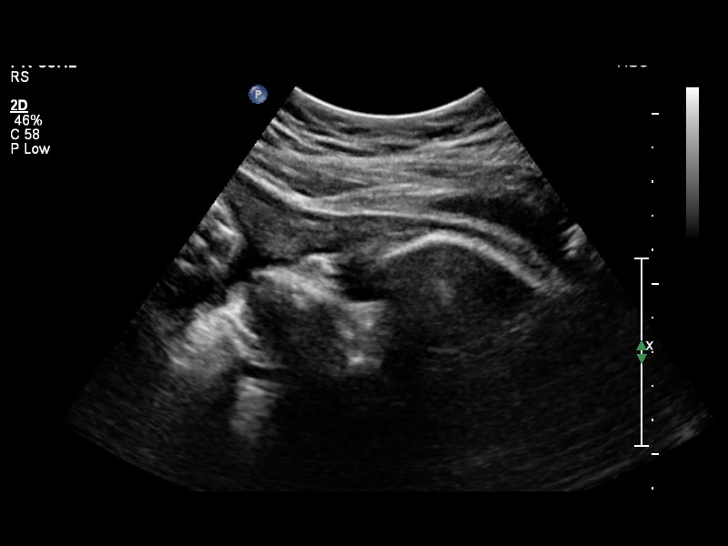
[im 3/23]
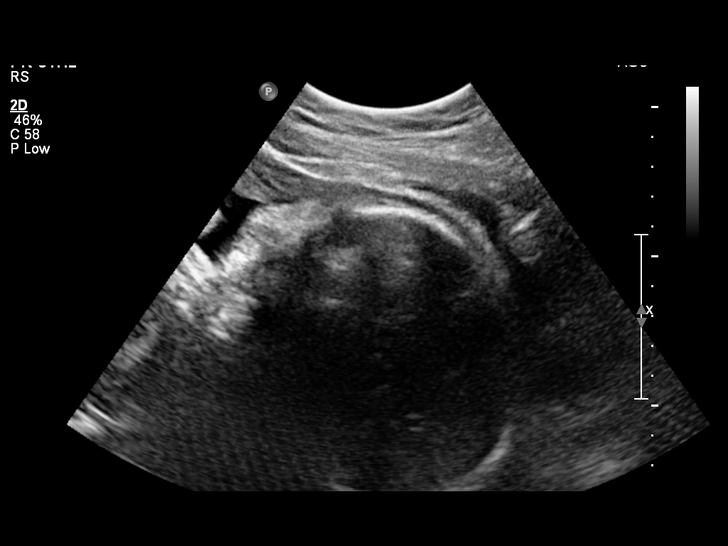
[im 5/23]
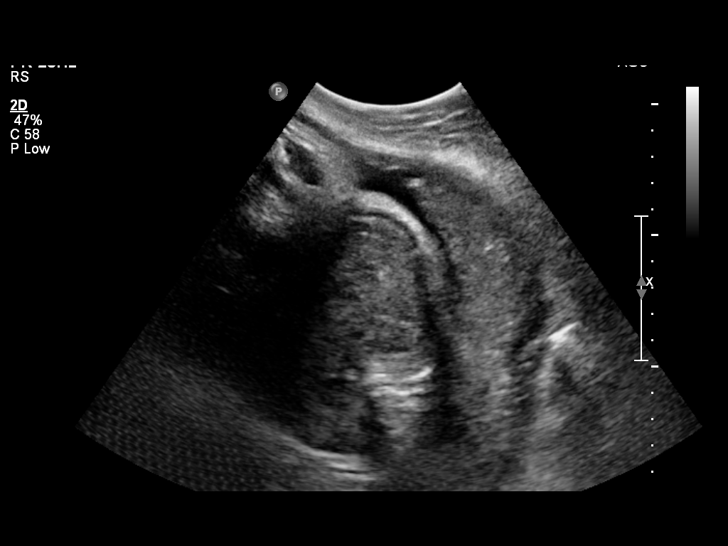
[im 6/23]
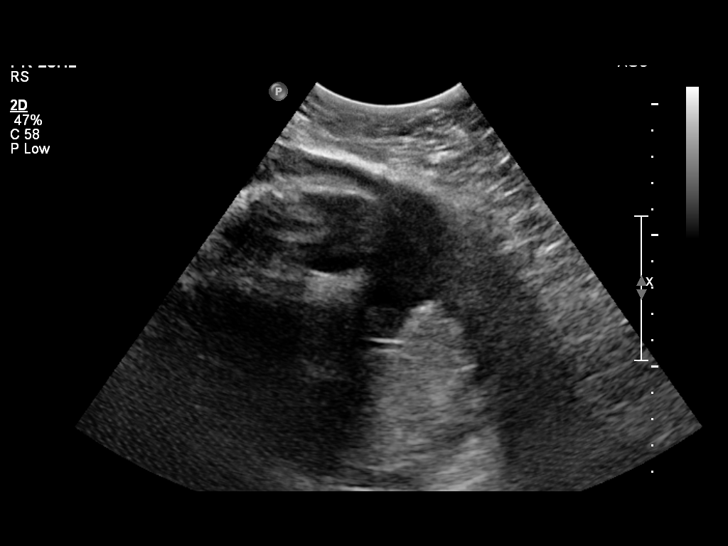
[im 8/23]
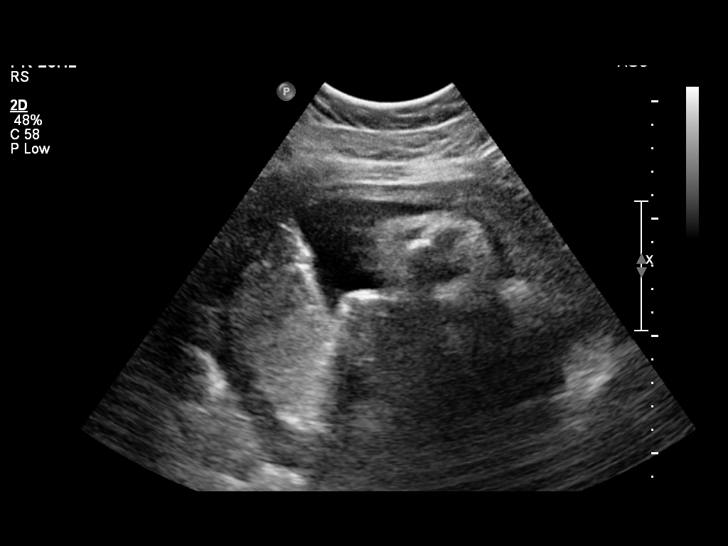
[im 10/23]
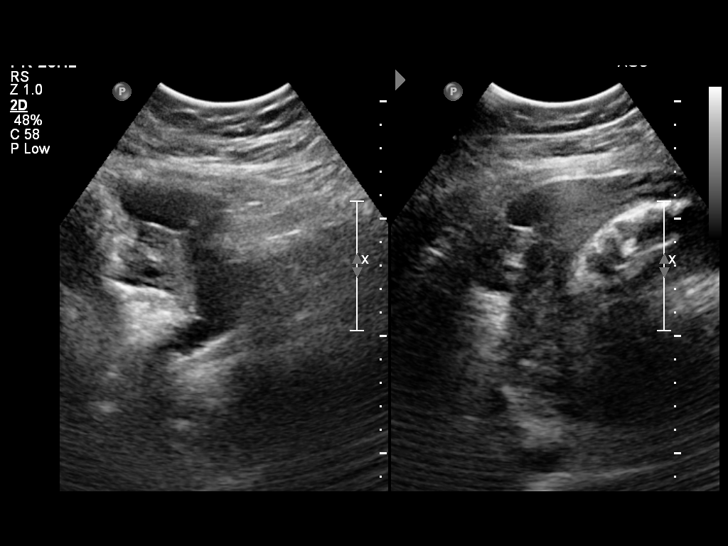
[im 11/23]
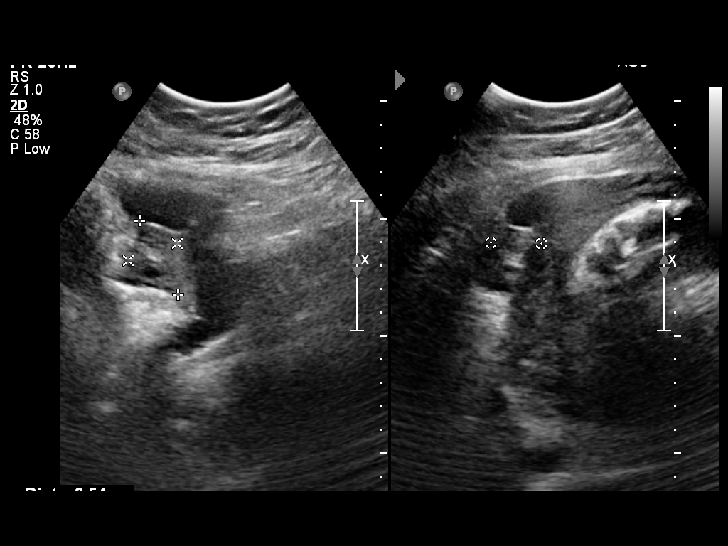
[im 13/23]
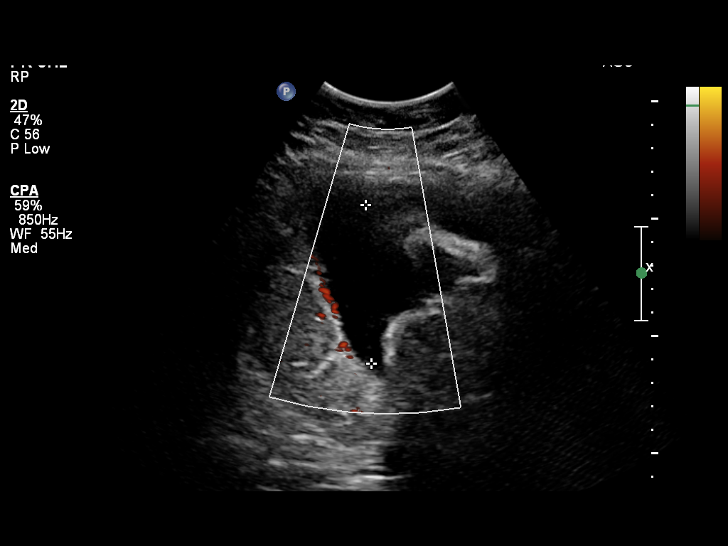
[im 14/23]
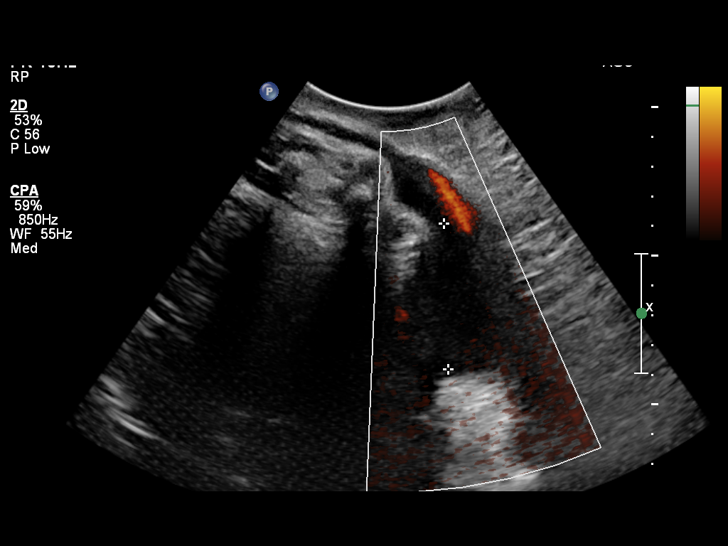
[im 16/23]
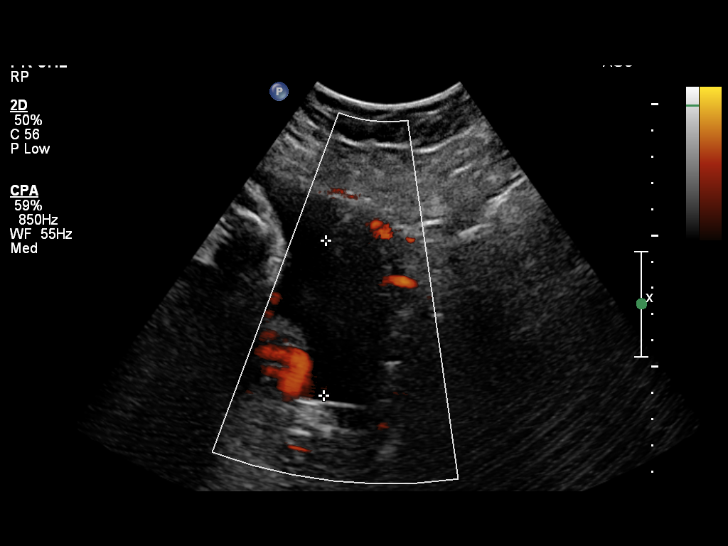
[im 18/23]
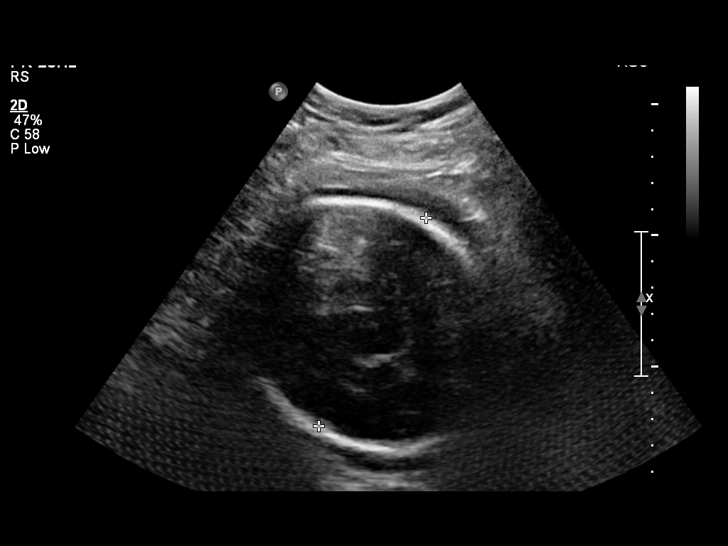
[im 19/23]
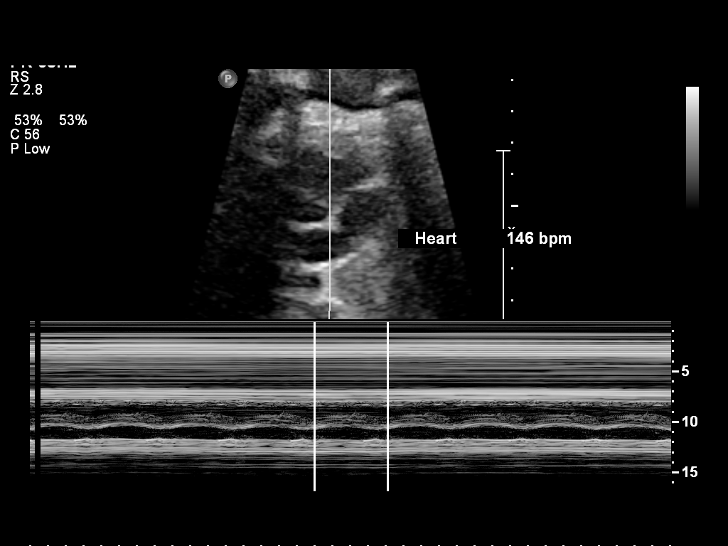
[im 21/23]
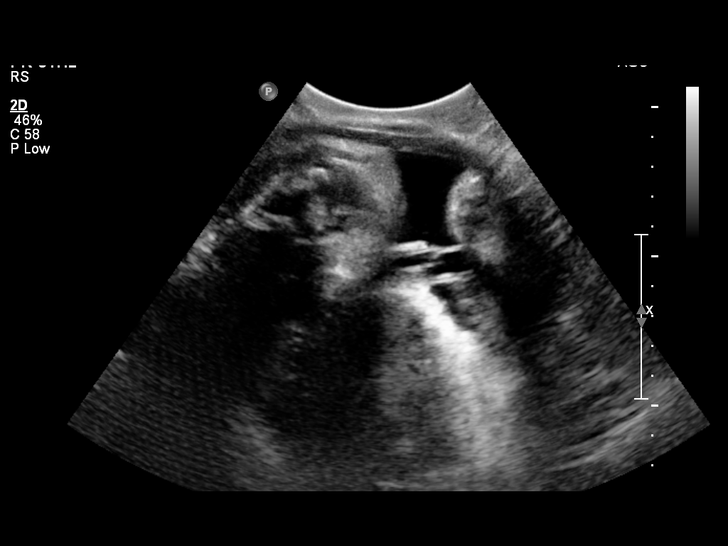
[im 23/23]
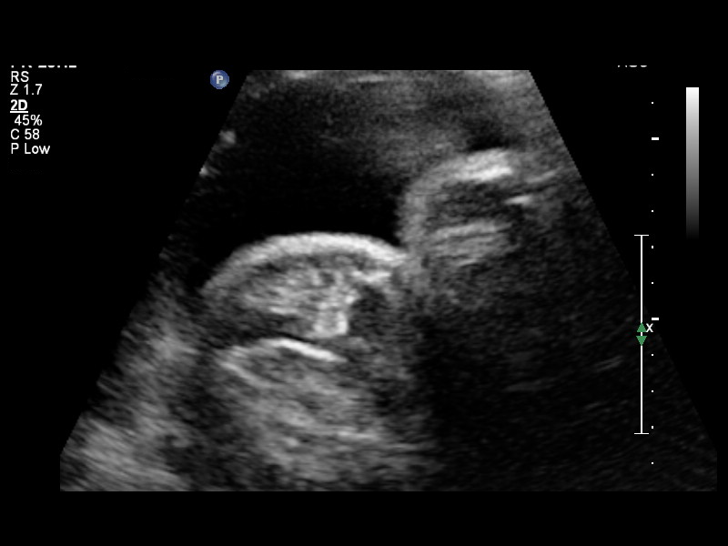

[14 of 23 positions shown; findings below may reference images not displayed]

LIMITED OBSTETRIC ULTRASOUND

Number of Fetuses: 1
Heart Rate: 146 bpm
Movement: Identified
Presentation: Cephalic
Placental Location: Posterior
Previa: Not identified
Amniotic Fluid (Subjective): Normal

Vertical pocket:  6.74cm      AFI: 22.68 cm (5%ile 7.5 cm, 95%ile
24.4 cm)

BPD: 8.89cm   36w   0d  EDC: 12/29/2011

MATERNAL FINDINGS:
Cervix: Not evaluated
Uterus/Adnexae: Normal sonographic appearance.  The

[Biophysical profile time elapsed:  30 minutes.]

Movement:  2

Breathing:  0

Tone:  2

Amniotic fluid:  2
IMPRESSION: Single intrauterine gestation with movement and cardiac
activity documented.  Estimated age of 36 weeks by BPD.

Biophysical profile [DATE] as above. Breathing noted intermittently
however not sustained.

## 2012-12-30 ENCOUNTER — Inpatient Hospital Stay (HOSPITAL_COMMUNITY): Payer: Medicaid Other

## 2012-12-30 ENCOUNTER — Encounter (HOSPITAL_COMMUNITY): Payer: Self-pay

## 2012-12-30 ENCOUNTER — Inpatient Hospital Stay (HOSPITAL_COMMUNITY)
Admission: AD | Admit: 2012-12-30 | Discharge: 2012-12-30 | Disposition: A | Discharge status: Home / Self Care | Payer: Medicaid Other | Source: Ambulatory Visit | Attending: Obstetrics & Gynecology | Admitting: Obstetrics & Gynecology

## 2012-12-30 DIAGNOSIS — R109 Unspecified abdominal pain: Secondary | ICD-10-CM | POA: Insufficient documentation

## 2012-12-30 DIAGNOSIS — Z3490 Encounter for supervision of normal pregnancy, unspecified, unspecified trimester: Secondary | ICD-10-CM

## 2012-12-30 DIAGNOSIS — B3731 Acute candidiasis of vulva and vagina: Secondary | ICD-10-CM | POA: Insufficient documentation

## 2012-12-30 DIAGNOSIS — B373 Candidiasis of vulva and vagina: Secondary | ICD-10-CM

## 2012-12-30 DIAGNOSIS — N949 Unspecified condition associated with female genital organs and menstrual cycle: Secondary | ICD-10-CM

## 2012-12-30 DIAGNOSIS — O2390 Unspecified genitourinary tract infection in pregnancy, unspecified trimester: Secondary | ICD-10-CM | POA: Insufficient documentation

## 2012-12-30 LAB — URINALYSIS, ROUTINE W REFLEX MICROSCOPIC
Glucose, UA: 100 mg/dL — AB
Hgb urine dipstick: NEGATIVE
Protein, ur: NEGATIVE mg/dL
Specific Gravity, Urine: 1.02 (ref 1.005–1.030)
Urobilinogen, UA: 0.2 mg/dL (ref 0.0–1.0)

## 2012-12-30 LAB — URINE MICROSCOPIC-ADD ON

## 2012-12-30 LAB — WET PREP, GENITAL: Trich, Wet Prep: NONE SEEN

## 2012-12-30 MED ORDER — FLUCONAZOLE 150 MG PO TABS: 150.0000 mg | ORAL_TABLET | Freq: Every day | ORAL | Status: DC

## 2012-12-30 MED ORDER — CYCLOBENZAPRINE HCL 10 MG PO TABS
10.0000 mg | ORAL_TABLET | Freq: Once | ORAL | Status: AC
  Administered 2012-12-30: 10 mg via ORAL
  Filled 2012-12-30: qty 1

## 2012-12-30 MED ORDER — FLUCONAZOLE 150 MG PO TABS
150.0000 mg | ORAL_TABLET | Freq: Every day | ORAL | Status: DC
  Administered 2012-12-30: 150 mg via ORAL
  Filled 2012-12-30: qty 1

## 2012-12-30 MED ORDER — CYCLOBENZAPRINE HCL 10 MG PO TABS: 10.0000 mg | ORAL_TABLET | Freq: Two times a day (BID) | ORAL | Status: DC | PRN

## 2012-12-30 NOTE — MAU Provider Note (Signed)
Attestation of Attending Supervision of Advanced Practitioner (CNM/NP): Evaluation and management procedures were performed by the Advanced Practitioner under my supervision and collaboration.  I have reviewed the Advanced Practitioner's note and chart, and I agree with the management and plan.  HARRAWAY-SMITH, Charnele Semple 6:50 PM
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
## 2012-12-31 LAB — URINE CULTURE: Culture: NO GROWTH

## 2012-12-31 LAB — OB RESULTS CONSOLE GC/CHLAMYDIA
Chlamydia: NEGATIVE
Gonorrhea: NEGATIVE

## 2013-01-16 ENCOUNTER — Inpatient Hospital Stay (HOSPITAL_COMMUNITY)
Admission: AD | Admit: 2013-01-16 | Discharge: 2013-01-16 | Disposition: A | Discharge status: Home / Self Care | Payer: Medicaid Other | Source: Ambulatory Visit | Attending: Obstetrics and Gynecology | Admitting: Obstetrics and Gynecology

## 2013-01-16 ENCOUNTER — Encounter (HOSPITAL_COMMUNITY): Payer: Self-pay | Admitting: *Deleted

## 2013-01-16 DIAGNOSIS — Z3482 Encounter for supervision of other normal pregnancy, second trimester: Secondary | ICD-10-CM

## 2013-01-16 DIAGNOSIS — O368390 Maternal care for abnormalities of the fetal heart rate or rhythm, unspecified trimester, not applicable or unspecified: Secondary | ICD-10-CM | POA: Insufficient documentation

## 2013-01-16 NOTE — MAU Note (Signed)
Unable to hear FH with "angel sounds" at home the last 2 days.  Unable to get into dr. Had had some pain,none today.

## 2013-01-29 ENCOUNTER — Other Ambulatory Visit: Payer: Self-pay

## 2013-01-29 LAB — OB RESULTS CONSOLE HIV ANTIBODY (ROUTINE TESTING): HIV: NONREACTIVE

## 2013-01-29 LAB — OB RESULTS CONSOLE ABO/RH: RH Type: POSITIVE

## 2013-01-29 LAB — OB RESULTS CONSOLE RPR: RPR: NONREACTIVE

## 2013-01-29 LAB — OB RESULTS CONSOLE RUBELLA ANTIBODY, IGM: RUBELLA: IMMUNE

## 2013-01-29 LAB — OB RESULTS CONSOLE HEPATITIS B SURFACE ANTIGEN: HEP B S AG: NEGATIVE

## 2013-01-29 LAB — OB RESULTS CONSOLE GC/CHLAMYDIA
Chlamydia: NEGATIVE
GC PROBE AMP, GENITAL: NEGATIVE

## 2013-01-29 LAB — OB RESULTS CONSOLE ANTIBODY SCREEN: Antibody Screen: NEGATIVE

## 2013-03-07 ENCOUNTER — Encounter (HOSPITAL_COMMUNITY): Payer: Self-pay | Admitting: *Deleted

## 2013-03-07 ENCOUNTER — Inpatient Hospital Stay (HOSPITAL_COMMUNITY)
Admission: AD | Admit: 2013-03-07 | Discharge: 2013-03-08 | Disposition: A | Discharge status: Home / Self Care | Payer: Medicaid Other | Source: Ambulatory Visit | Attending: Obstetrics & Gynecology | Admitting: Obstetrics & Gynecology

## 2013-03-07 DIAGNOSIS — R109 Unspecified abdominal pain: Secondary | ICD-10-CM | POA: Insufficient documentation

## 2013-03-07 DIAGNOSIS — N949 Unspecified condition associated with female genital organs and menstrual cycle: Secondary | ICD-10-CM

## 2013-03-07 DIAGNOSIS — O99891 Other specified diseases and conditions complicating pregnancy: Secondary | ICD-10-CM | POA: Insufficient documentation

## 2013-03-07 LAB — COMPREHENSIVE METABOLIC PANEL
ALT: 21 U/L (ref 0–35)
Albumin: 2.4 g/dL — ABNORMAL LOW (ref 3.5–5.2)
Alkaline Phosphatase: 75 U/L (ref 39–117)
Chloride: 102 mEq/L (ref 96–112)
GFR calc Af Amer: >90 mL/min (ref >90)
GFR calc non Af Amer: >90 mL/min (ref >90)
Glucose, Bld: 80 mg/dL (ref 70–99)
Potassium: 3.7 mEq/L (ref 3.5–5.1)
Sodium: 137 mEq/L (ref 135–145)
Total Bilirubin: <0.1 mg/dL — ABNORMAL LOW (ref 0.3–1.2)
Total Protein: 6.4 g/dL (ref 6.0–8.3)

## 2013-03-07 LAB — CBC
HCT: 33.7 % — ABNORMAL LOW (ref 36.0–46.0)
MCHC: 33.5 g/dL (ref 30.0–36.0)
MCV: 89.4 fL (ref 78.0–100.0)
Platelets: 231 10*3/uL (ref 150–400)
RDW: 15.6 % — ABNORMAL HIGH (ref 11.5–15.5)
WBC: 9 10*3/uL (ref 4.0–10.5)

## 2013-03-07 LAB — URINALYSIS, ROUTINE W REFLEX MICROSCOPIC
Glucose, UA: 500 mg/dL — AB
Hgb urine dipstick: NEGATIVE
Nitrite: NEGATIVE
Specific Gravity, Urine: >1.03 — ABNORMAL HIGH (ref 1.005–1.030)
pH: 6 (ref 5.0–8.0)

## 2013-03-07 NOTE — MAU Note (Signed)
Patient reports to MAU with c/o braxton hicks contractions that have gotten worse over the last couple of days. Reports having taken a whole bottle of tylenol in the last 2 days for the pain with no relief (patient states doesn't remember how big the bottle was; just that they were extra strength). Denies LOF or VB at this time. Reports good fetal movement.

## 2013-03-07 NOTE — MAU Provider Note (Signed)
History     CSN: [id number]  Arrival date and time: 03/07/13 2143   None     Chief Complaint  Patient presents with  . Abdominal Pain   HPI  Ms. Kristin [name] is a 22 y.o. female G2P1001 at [redacted]w[redacted]d who presents with abdominal pain; middle lower abdomen. The pain is constant, does not hurt to urinate, however does hurt to have a bowel movement. The pain started 2 weeks ago, however recently has gotten worse. At 0200 she opened a new bottle of extra strength tylenol and has finished the entire bottle of extra strength tylenol; pt beliefs it was 20-25 pills. Pt denies the thought of harm to herself; took the medicine because of the pain.    OB History   Grav Para Term Preterm Abortions TAB SAB Ect Mult Living   2 1 1  0 0 0 0 0 0 1      Past Medical History  Diagnosis Date  . No pertinent past medical history     Past Surgical History  Procedure Laterality Date  . No past surgeries      Family History  Problem Relation Age of Onset  . Diabetes Maternal Uncle   . Other Neg Hx     History  Substance Use Topics  . Smoking status: Never Smoker   . Smokeless tobacco: Not on file  . Alcohol Use: 1.2 oz/week    2 Glasses of wine per week     Comment: occasional ETOH use prior to pregnancy    Allergies: No Known Allergies  Prescriptions prior to admission  Medication Sig Dispense Refill  . acetaminophen (TYLENOL) 500 MG tablet Take 1,000 mg by mouth every 2 (two) hours as needed for moderate pain.      . Prenatal Vit-Fe Fumarate-FA (PRENATAL MULTIVITAMIN) TABS tablet Take 1 tablet by mouth daily at 12 noon.       Results for orders placed during the hospital encounter of 03/07/13 (from the past 24 hour(s))  URINALYSIS, ROUTINE W REFLEX MICROSCOPIC     Status: Abnormal   Collection Time    03/07/13  9:57 PM      Result Value Range   Color, Urine YELLOW  YELLOW   APPearance CLEAR  CLEAR   Specific Gravity, Urine >1.030 (*) 1.005 - 1.030   pH 6.0  5.0 - 8.0   Glucose,  UA 500 (*) NEGATIVE mg/dL   Hgb urine dipstick NEGATIVE  NEGATIVE   Bilirubin Urine NEGATIVE  NEGATIVE   Ketones, ur 15 (*) NEGATIVE mg/dL   Protein, ur NEGATIVE  NEGATIVE mg/dL   Urobilinogen, UA 0.2  0.0 - 1.0 mg/dL   Nitrite NEGATIVE  NEGATIVE   Leukocytes, UA NEGATIVE  NEGATIVE  COMPREHENSIVE METABOLIC PANEL     Status: Abnormal   Collection Time    03/07/13 10:55 PM      Result Value Range   Sodium 137  135 - 145 mEq/L   Potassium 3.7  3.5 - 5.1 mEq/L   Chloride 102  96 - 112 mEq/L   CO2 26  19 - 32 mEq/L   Glucose, Bld 80  70 - 99 mg/dL   BUN 8  6 - 23 mg/dL   Creatinine, Ser 4.09  0.50 - 1.10 mg/dL   Calcium 9.0  8.4 - 81.1 mg/dL   Total Protein 6.4  6.0 - 8.3 g/dL   Albumin 2.4 (*) 3.5 - 5.2 g/dL   AST 19  0 - 37 U/L   ALT 21  0 - 35 U/L   Alkaline Phosphatase 75  39 - 117 U/L   Total Bilirubin <0.1 (*) 0.3 - 1.2 mg/dL   GFR calc non Af Amer >90  >90 mL/min   GFR calc Af Amer >90  >90 mL/min  CBC     Status: Abnormal   Collection Time    03/07/13 10:55 PM      Result Value Range   WBC 9.0  4.0 - 10.5 K/uL   RBC 3.77 (*) 3.87 - 5.11 MIL/uL   Hemoglobin 11.3 (*) 12.0 - 15.0 g/dL   HCT 45.4 (*) 09.8 - 11.9 %   MCV 89.4  78.0 - 100.0 fL   MCH 30.0  26.0 - 34.0 pg   MCHC 33.5  30.0 - 36.0 g/dL   RDW 14.7 (*) 82.9 - 56.2 %   Platelets 231  150 - 400 K/uL  ACETAMINOPHEN LEVEL     Status: None   Collection Time    03/07/13 10:55 PM      Result Value Range   Acetaminophen (Tylenol), Serum <15.0  10 - 30 ug/mL    Review of Systems  Constitutional: Negative for fever and chills.  Gastrointestinal: Positive for abdominal pain. Negative for diarrhea and constipation.  Genitourinary: Negative for dysuria, urgency, frequency and hematuria.       No vaginal discharge. No vaginal bleeding. No dysuria.   Neurological: Negative for seizures.  Psychiatric/Behavioral: Negative for depression and suicidal ideas.   Physical Exam   Blood pressure 128/75, pulse 115,  temperature 97.5 F (36.4 C), temperature source Oral, resp. rate 18, height 5\' 5"  (1.651 m), weight 117.572 kg (259 lb 3.2 oz), SpO2 100.00%.  Physical Exam  Constitutional: She is oriented to person, place, and time. She appears well-developed and well-nourished. No distress.  HENT:  Head: Normocephalic.  Eyes: Pupils are equal, round, and reactive to light.  Neck: Neck supple.  Respiratory: Effort normal.  Neurological: She is alert and oriented to person, place, and time.  Skin: Skin is warm. She is not diaphoretic.  Psychiatric: Her behavior is normal.    Fetal Tracing: Baseline: 150 BPM Variability: Moderate Accelerations: 10x10 Decelerations: variable  Toco: None   Dilation: Closed Effacement (%): Thick Cervical Position: Middle Exam by:: Shela Commons Valorie Mcgrory NP   MAU Course  Procedures None  MDM UA Consulted with Dr. Arlyce Dice  Plan of care discussed; CBC, CMET, Acetaminophen Level  0100: informed Dr. Arlyce Dice of results. Ok to send patient home with flexeril  One dose of flexeril given prior to discharge home  Assessment and Plan   A: Round ligament pain  P: Discharge home Stop taking tylenol RX: Flexeril  Support belt recommeneded and discussed Return to MAU as needed, if symptoms worsen Keep your next OB appointment.   Jacquees Gongora IRENE NP 03/07/2013, 10:32 PM

## 2013-03-08 LAB — ACETAMINOPHEN LEVEL: Acetaminophen (Tylenol), Serum: <15 ug/mL (ref 10–30)

## 2013-03-08 MED ORDER — CYCLOBENZAPRINE HCL 10 MG PO TABS: 10.0000 mg | ORAL_TABLET | Freq: Two times a day (BID) | ORAL | Status: DC | PRN

## 2013-03-08 MED ORDER — CYCLOBENZAPRINE HCL 10 MG PO TABS
10.0000 mg | ORAL_TABLET | Freq: Once | ORAL | Status: AC
  Administered 2013-03-08: 10 mg via ORAL
  Filled 2013-03-08: qty 1

## 2013-04-01 ENCOUNTER — Encounter (HOSPITAL_COMMUNITY): Payer: Self-pay

## 2013-04-01 ENCOUNTER — Inpatient Hospital Stay (HOSPITAL_COMMUNITY)
Admission: AD | Admit: 2013-04-01 | Discharge: 2013-04-01 | Disposition: A | Discharge status: Home / Self Care | Payer: Medicaid Other | Source: Ambulatory Visit | Attending: Obstetrics and Gynecology | Admitting: Obstetrics and Gynecology

## 2013-04-01 DIAGNOSIS — O26619 Liver and biliary tract disorders in pregnancy, unspecified trimester: Secondary | ICD-10-CM | POA: Insufficient documentation

## 2013-04-01 DIAGNOSIS — L299 Pruritus, unspecified: Secondary | ICD-10-CM | POA: Insufficient documentation

## 2013-04-01 DIAGNOSIS — K838 Other specified diseases of biliary tract: Secondary | ICD-10-CM | POA: Insufficient documentation

## 2013-04-01 DIAGNOSIS — O9989 Other specified diseases and conditions complicating pregnancy, childbirth and the puerperium: Secondary | ICD-10-CM | POA: Insufficient documentation

## 2013-04-01 LAB — HEPATIC FUNCTION PANEL
ALT: 29 U/L (ref 0–35)
Albumin: 2.6 g/dL — ABNORMAL LOW (ref 3.5–5.2)
Alkaline Phosphatase: 96 U/L (ref 39–117)
Bilirubin, Direct: 0.2 mg/dL (ref 0.0–0.3)
Indirect Bilirubin: 0 mg/dL — ABNORMAL LOW (ref 0.3–0.9)
Total Protein: 7 g/dL (ref 6.0–8.3)

## 2013-04-01 MED ORDER — URSODIOL 300 MG PO CAPS: 300.0000 mg | ORAL_CAPSULE | Freq: Three times a day (TID) | ORAL | Status: DC

## 2013-04-01 NOTE — MAU Note (Addendum)
Has been going back and forth to the dr for some very bad itching (hands, feet, all over body).  Dr called her and told her to come in for blood work and be monitored.

## 2013-04-01 NOTE — MAU Note (Signed)
Patient states that the itching is intense and gets worse at night without relief from numerous variety of home remedies, cortizone creams, benadryl etc. She states that she does not have rashes. Denies pain, vaginal bleeding or LOF. She reports good fetal movement.

## 2013-04-01 NOTE — MAU Provider Note (Signed)
History     CSN: [id number]  Arrival date and time: 04/01/13 1755   First Provider Initiated Contact with Patient 04/01/13 1906      Chief Complaint  Patient presents with  . Pruritis   HPI Ms. Kristin [name] is a 22 y.o. G2P1001 at [redacted]w[redacted]d who presents to MAU today with complaint of diffuse itching since October. The patient states that it has been getting worse recently. She is having trouble sleeping because of it and often feels nauseous at night. She denies rash, fever, contractions, vaginal bleeding, abnormal discharge or LOF. The patient states that occasionally she gets "welps" after itching. She reports good fetal movement.   OB History   Grav Para Term Preterm Abortions TAB SAB Ect Mult Living   2 1 1  0 0 0 0 0 0 1      Past Medical History  Diagnosis Date  . No pertinent past medical history     Past Surgical History  Procedure Laterality Date  . No past surgeries      Family History  Problem Relation Age of Onset  . Diabetes Maternal Uncle   . Other Neg Hx     History  Substance Use Topics  . Smoking status: Never Smoker   . Smokeless tobacco: Not on file  . Alcohol Use: 1.2 oz/week    2 Glasses of wine per week     Comment: occasional ETOH use prior to pregnancy    Allergies: No Known Allergies  No prescriptions prior to admission    Review of Systems  Constitutional: Negative for fever.  Gastrointestinal: Positive for nausea. Negative for vomiting, abdominal pain, diarrhea and constipation.  Genitourinary: Negative for dysuria, urgency and frequency.       Neg - vaginal bleeding, abnormal discharge, LOF  Skin: Negative for rash.   Physical Exam   Blood pressure 123/64, pulse 93, temperature 98.5 F (36.9 C), temperature source Oral, resp. rate 20, height 5\' 5"  (1.651 m), weight 262 lb (118.842 kg).  Physical Exam  Constitutional: She is oriented to person, place, and time. She appears well-developed and well-nourished. No distress.  HENT:   Head: Normocephalic and atraumatic.  Cardiovascular: Normal rate, regular rhythm and normal heart sounds.   Respiratory: Effort normal and breath sounds normal. No respiratory distress.  GI: Soft. Bowel sounds are normal. She exhibits no distension and no mass. There is no tenderness. There is no rebound and no guarding.  Neurological: She is alert and oriented to person, place, and time.  Skin: Skin is warm and dry. No rash noted. No erythema.  Psychiatric: She has a normal mood and affect.   Results for orders placed during the hospital encounter of 04/01/13 (from the past 24 hour(s))  HEPATIC FUNCTION PANEL     Status: Abnormal   Collection Time    04/01/13  6:56 PM      Result Value Range   Total Protein 7.0  6.0 - 8.3 g/dL   Albumin 2.6 (*) 3.5 - 5.2 g/dL   AST 23  0 - 37 U/L   ALT 29  0 - 35 U/L   Alkaline Phosphatase 96  39 - 117 U/L   Total Bilirubin 0.2 (*) 0.3 - 1.2 mg/dL   Bilirubin, Direct 0.2  0.0 - 0.3 mg/dL   Indirect Bilirubin 0.0 (*) 0.3 - 0.9 mg/dL    Fetal Monitoring: Baseline: 150 bpm, moderate variability, + accelerations, no decelerations Contractions: none MAU Course  Procedures None  MDM Discussed with Dr.  Horvath. Order Bile acids and LFTs today.  Bile Acids pending Discussed results with Dr. Henderson Cloud. Discharge patient with Rx for ursodiol 300 mg TID. Report given to Thressa Sheller, CNM to follow-up results later this week. She will report to MD on-call for [facility]. Patient to call the office if she has not been contacted with results by Friday.  Assessment and Plan  A: Diffuse pruritis; possible secondary to cholestasis of pregnancy  P: Discharge home Rx for Ursodiol given to patient with instructions to take until MD tells her otherwise Patient instructed that she should be contacted with results. Patient may call the office if she has not been notified with results by Friday Patient should follow-up in the office as scheduled unless  otherwise instructed Patient may return to MAU as needed or if her condition were to change or worsen  Freddi Starr, PA-C  04/01/2013, 8:50 PM

## 2013-04-03 LAB — BILE ACIDS, TOTAL: Bile Acids Total: 10 umol/L (ref 0–19)

## 2013-04-03 NOTE — L&D Delivery Note (Signed)
Delivery Note At 3:53 PM a viable female was delivered via Vaginal, Spontaneous Delivery (Presentation: Left Occiput Anterior).  APGAR: 8, 9; weight 5 lb 4 oz (2381 g).   Placenta status: , Spontaneous.  Cord: 3 vessels with the following complications: None.  Cord gases not obtained Anesthesia: Epidural  Episiotomy: None Lacerations: None  Est. Blood Loss (mL): 300  Mom to postpartum.  Baby to Nursery.  Essie HartINN, Teffany Blaszczyk STACIA 05/28/2013, 9:17 AM

## 2013-05-01 ENCOUNTER — Inpatient Hospital Stay (HOSPITAL_COMMUNITY): Payer: Medicaid Other

## 2013-05-01 ENCOUNTER — Encounter (HOSPITAL_COMMUNITY): Payer: Self-pay | Admitting: *Deleted

## 2013-05-01 ENCOUNTER — Observation Stay (HOSPITAL_COMMUNITY)
Admission: AD | Admit: 2013-05-01 | Discharge: 2013-05-02 | Disposition: A | Discharge status: Home / Self Care | Payer: Medicaid Other | Source: Ambulatory Visit | Attending: Obstetrics and Gynecology | Admitting: Obstetrics and Gynecology

## 2013-05-01 DIAGNOSIS — O365990 Maternal care for other known or suspected poor fetal growth, unspecified trimester, not applicable or unspecified: Secondary | ICD-10-CM | POA: Insufficient documentation

## 2013-05-01 DIAGNOSIS — K819 Cholecystitis, unspecified: Secondary | ICD-10-CM

## 2013-05-01 DIAGNOSIS — R7402 Elevation of levels of lactic acid dehydrogenase (LDH): Secondary | ICD-10-CM | POA: Insufficient documentation

## 2013-05-01 DIAGNOSIS — R7401 Elevation of levels of liver transaminase levels: Secondary | ICD-10-CM | POA: Insufficient documentation

## 2013-05-01 DIAGNOSIS — L299 Pruritus, unspecified: Secondary | ICD-10-CM | POA: Insufficient documentation

## 2013-05-01 DIAGNOSIS — O26619 Liver and biliary tract disorders in pregnancy, unspecified trimester: Principal | ICD-10-CM | POA: Insufficient documentation

## 2013-05-01 DIAGNOSIS — O9989 Other specified diseases and conditions complicating pregnancy, childbirth and the puerperium: Secondary | ICD-10-CM | POA: Insufficient documentation

## 2013-05-01 DIAGNOSIS — R740 Nonspecific elevation of levels of transaminase and lactic acid dehydrogenase [LDH]: Secondary | ICD-10-CM

## 2013-05-01 DIAGNOSIS — K838 Other specified diseases of biliary tract: Secondary | ICD-10-CM | POA: Insufficient documentation

## 2013-05-01 LAB — HEPATIC FUNCTION PANEL
ALBUMIN: 2.5 g/dL — AB (ref 3.5–5.2)
ALT: 44 U/L — AB (ref 0–35)
AST: 33 U/L (ref 0–37)
Alkaline Phosphatase: 106 U/L (ref 39–117)
Bilirubin, Direct: <0.2 mg/dL (ref 0.0–0.3)
Total Protein: 6.7 g/dL (ref 6.0–8.3)

## 2013-05-01 MED ORDER — ACETAMINOPHEN 325 MG PO TABS
650.0000 mg | ORAL_TABLET | ORAL | Status: DC | PRN
Start: 2013-05-01 — End: 2013-05-02
  Administered 2013-05-01 – 2013-05-02 (×2): 650 mg via ORAL
  Filled 2013-05-01 (×3): qty 2

## 2013-05-01 MED ORDER — PRENATAL MULTIVITAMIN CH
1.0000 | ORAL_TABLET | Freq: Every day | ORAL | Status: DC
  Administered 2013-05-02: 1 via ORAL
  Filled 2013-05-01: qty 1

## 2013-05-01 MED ORDER — CALCIUM CARBONATE ANTACID 500 MG PO CHEW: 2.0000 | CHEWABLE_TABLET | ORAL | Status: DC | PRN

## 2013-05-01 MED ORDER — ZOLPIDEM TARTRATE 5 MG PO TABS
5.0000 mg | ORAL_TABLET | Freq: Every evening | ORAL | Status: DC | PRN
  Administered 2013-05-01: 5 mg via ORAL
  Filled 2013-05-01 (×2): qty 1

## 2013-05-01 MED ORDER — DOCUSATE SODIUM 100 MG PO CAPS
100.0000 mg | ORAL_CAPSULE | Freq: Every day | ORAL | Status: DC
  Administered 2013-05-02: 100 mg via ORAL
  Filled 2013-05-01: qty 1

## 2013-05-01 MED ORDER — DIPHENHYDRAMINE HCL 25 MG PO CAPS: 25.0000 mg | ORAL_CAPSULE | Freq: Every evening | ORAL | Status: DC | PRN

## 2013-05-01 MED ORDER — URSODIOL 300 MG PO CAPS
300.0000 mg | ORAL_CAPSULE | Freq: Three times a day (TID) | ORAL | Status: DC
  Administered 2013-05-01 – 2013-05-02 (×3): 300 mg via ORAL
  Filled 2013-05-01 (×7): qty 1

## 2013-05-01 NOTE — Progress Notes (Signed)
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

## 2013-05-01 NOTE — MAU Note (Signed)
Pt sent over from the office for further evaluation.

## 2013-05-01 NOTE — MAU Note (Signed)
Went to dr's office,sent over from there- here for lab work and failed NST

## 2013-05-01 NOTE — MAU Provider Note (Signed)
History     CSN: [id number]  Arrival date and time: 05/01/13 1815   None     Chief Complaint  Patient presents with  . non-reactive tracing; choleystasis of preg    HPI This is a 23 y.o. female at [redacted]w[redacted]d who presents for BPP, AFI, and labs. She has been diagnosed as cholestasis and needs bile salts and LFTs. Per order Dr Henderson Cloud.   RN Note: Went to dr's office,sent over from there- here for lab work and failed NST       OB History   Grav Para Term Preterm Abortions TAB SAB Ect Mult Living   2 1 1  0 0 0 0 0 0 1      Past Medical History  Diagnosis Date  . No pertinent past medical history     Past Surgical History  Procedure Laterality Date  . No past surgeries      Family History  Problem Relation Age of Onset  . Diabetes Maternal Uncle   . Other Neg Hx     History  Substance Use Topics  . Smoking status: Never Smoker   . Smokeless tobacco: Not on file  . Alcohol Use: 1.2 oz/week    2 Glasses of wine per week     Comment: occasional ETOH use prior to pregnancy    Allergies: No Known Allergies  Prescriptions prior to admission  Medication Sig Dispense Refill  . calcium carbonate (TUMS - DOSED IN MG ELEMENTAL CALCIUM) 500 MG chewable tablet Chew 2 tablets by mouth 5 (five) times daily as needed for indigestion or heartburn.      . diphenhydrAMINE (BENADRYL) 25 MG tablet Take 25 mg by mouth at bedtime as needed for itching.      . hydrocortisone cream 1 % Apply 1 application topically 4 (four) times daily as needed for itching.      . hydrOXYzine (ATARAX/VISTARIL) 25 MG tablet Take 25 mg by mouth at bedtime.      . Prenatal Vit-Fe Fumarate-FA (PRENATAL MULTIVITAMIN) TABS tablet Take 1 tablet by mouth daily at 12 noon.      . ursodiol (ACTIGALL) 300 MG capsule Take 1 capsule (300 mg total) by mouth 3 (three) times daily.  30 capsule  0    Review of Systems  Constitutional: Negative for fever, chills and malaise/fatigue.  Gastrointestinal: Negative for  nausea, vomiting and abdominal pain.  Skin: Positive for itching. Negative for rash.   Physical Exam   Blood pressure 117/70, pulse 90, temperature 98.3 F (36.8 C), temperature source Oral, resp. rate 18, height 5\' 6"  (1.676 m), weight 260 lb (117.935 kg).  Physical Exam  Constitutional: She is oriented to person, place, and time. She appears well-developed and well-nourished. No distress.  HENT:  Head: Normocephalic.  Cardiovascular: Normal rate.   Respiratory: Effort normal.  GI: Soft. There is no tenderness. There is no rebound and no guarding.  Musculoskeletal: Normal range of motion.  Neurological: She is alert and oriented to person, place, and time.  Skin: Skin is warm and dry.  Psychiatric: She has a normal mood and affect.   Results for orders placed during the hospital encounter of 05/01/13 (from the past 24 hour(s))  HEPATIC FUNCTION PANEL     Status: Abnormal   Collection Time    05/01/13  6:45 PM      Result Value Range   Total Protein 6.7  6.0 - 8.3 g/dL   Albumin 2.5 (*) 3.5 - 5.2 g/dL   AST 33  0 - 37 U/L   ALT 44 (*) 0 - 35 U/L   Alkaline Phosphatase 106  39 - 117 U/L   Total Bilirubin <0.2 (*) 0.3 - 1.2 mg/dL   Bilirubin, Direct <1.6<0.2  0.0 - 0.3 mg/dL   Indirect Bilirubin NOT CALCULATED  0.3 - 0.9 mg/dL    MAU Course  Procedures  MDM Bile Acids, Hepatic Panel, BPP, and AFI ordered  Assessment and Plan  Report to oncoming NP  ETHIER, JULIE N. 05/01/2013, 9:08 PM   A: Elevated LFTs   P: Admit to Antenatal for observation Consult with MFM and possibly GI in the morning  Freddi StarrJulie N Ethier, PA-C 05/01/2013 9:08 PM

## 2013-05-02 ENCOUNTER — Inpatient Hospital Stay (HOSPITAL_COMMUNITY): Payer: Medicaid Other

## 2013-05-02 LAB — CBC
HEMATOCRIT: 33 % — AB (ref 36.0–46.0)
HEMOGLOBIN: 11.2 g/dL — AB (ref 12.0–15.0)
MCH: 30.4 pg (ref 26.0–34.0)
MCHC: 33.9 g/dL (ref 30.0–36.0)
MCV: 89.7 fL (ref 78.0–100.0)
Platelets: 237 10*3/uL (ref 150–400)
RBC: 3.68 MIL/uL — AB (ref 3.87–5.11)
RDW: 14.7 % (ref 11.5–15.5)
WBC: 6.4 10*3/uL (ref 4.0–10.5)

## 2013-05-02 LAB — COMPREHENSIVE METABOLIC PANEL
ALT: 41 U/L — AB (ref 0–35)
AST: 28 U/L (ref 0–37)
Albumin: 2.3 g/dL — ABNORMAL LOW (ref 3.5–5.2)
Alkaline Phosphatase: 95 U/L (ref 39–117)
BUN: 6 mg/dL (ref 6–23)
CALCIUM: 8.6 mg/dL (ref 8.4–10.5)
CO2: 23 mEq/L (ref 19–32)
Chloride: 105 mEq/L (ref 96–112)
Creatinine, Ser: 0.64 mg/dL (ref 0.50–1.10)
GFR calc Af Amer: >90 mL/min (ref >90)
GFR calc non Af Amer: >90 mL/min (ref >90)
GLUCOSE: 97 mg/dL (ref 70–99)
Potassium: 3.8 mEq/L (ref 3.7–5.3)
SODIUM: 140 meq/L (ref 137–147)
Total Bilirubin: <0.2 mg/dL — ABNORMAL LOW (ref 0.3–1.2)
Total Protein: 6.1 g/dL (ref 6.0–8.3)

## 2013-05-02 LAB — URIC ACID: Uric Acid, Serum: 3.3 mg/dL (ref 2.4–7.0)

## 2013-05-02 MED ORDER — OXYCODONE-ACETAMINOPHEN 5-325 MG PO TABS
1.0000 | ORAL_TABLET | Freq: Once | ORAL | Status: AC
  Administered 2013-05-02: 1 via ORAL
  Filled 2013-05-02: qty 1

## 2013-05-02 MED ORDER — INFLUENZA VAC SPLIT QUAD 0.5 ML IM SUSP
0.5000 mL | INTRAMUSCULAR | Status: AC
  Administered 2013-05-02: 0.5 mL via INTRAMUSCULAR
  Filled 2013-05-02: qty 0.5

## 2013-05-02 MED ORDER — URSODIOL 300 MG PO CAPS: 600.0000 mg | ORAL_CAPSULE | Freq: Three times a day (TID) | ORAL | Status: DC

## 2013-05-02 MED ORDER — INFLUENZA VAC SPLIT QUAD 0.5 ML IM SUSP
0.5000 mL | INTRAMUSCULAR | Status: DC
  Filled 2013-05-02: qty 0.5

## 2013-05-02 NOTE — Consult Note (Signed)
MFM Note  Kristin [name] is a 23 year old G2P1 AA female at 33+5 weeks who was admitted yesterday for an exacerbation of her cholestasis of pregnancy. She was diagnosed at the end of December and was started of Actigall 300 mg three times a day and hydroxyzine to help with sleep. Initially the medications helped relieve the pruritis but just a few days ago, the itching returned and Kristin [name] was miserable. Her LFTs were checked and the ALT was mildly elevated. She reported no other complications during her prenatal course.  She reports good fetal movement most days and has been having nonstress tests twice weekly.  Ultrasound today: singleton in cephalic position with EFW at the 27th %tile; AC < 3rd %tile; normal AFV and normal UA dopplers  Fetal tracing appeared to be reactive without significant decelerations.  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

## 2013-05-02 NOTE — Progress Notes (Signed)
Pt. Is stable and ready to be discharged. All belongings are with the patient. All discharge instructions given and prescriptions reviewed. Pt. Walked out with her significant other.

## 2013-05-02 NOTE — H&P (Signed)
23 y.o. G2P1001 1548w5d HD#1 admitted for 2833 WKS, BLOODWORK, NST SCAN.  Pt with cholestasis of pregnancy.   Please see detailed NP note; briefly pt was seen at office for routine visit and NST yesterday.  She was complaining that her itching and sx were increasing despite the treatment with Ursodiol.  Her NST was not strictly reactive in the office and she was sent for further evaluation in MAU.  She denies N/V or other sx of preeclampsia.  In MAU she was noted to have elevated ALT which is a new onset.  BPP was 8/8 with normal fluid and NST was finally reactive- last tracing at 19:30.  Pt currently stable with no c/o bleeding or LOF.  Good FM.  Pt has no significant medical or surgical history.  Filed Vitals:   05/02/13 0233  BP: 107/59  Pulse: 90  Temp: 98.1 F (36.7 C)  Resp: 18    Lungs CTA Cor RRR Abd  Soft, gravid, nontender Ex SCDs FHTs  120s, good short term variability, NST R at 19:30 Toco  occ SVE deferred  Results for orders placed during the hospital encounter of 05/01/13 (from the past 24 hour(s))  HEPATIC FUNCTION PANEL     Status: Abnormal   Collection Time    05/01/13  6:45 PM      Result Value Range   Total Protein 6.7  6.0 - 8.3 g/dL   Albumin 2.5 (*) 3.5 - 5.2 g/dL   AST 33  0 - 37 U/L   ALT 44 (*) 0 - 35 U/L   Alkaline Phosphatase 106  39 - 117 U/L   Total Bilirubin <0.2 (*) 0.3 - 1.2 mg/dL   Bilirubin, Direct <1.6<0.2  0.0 - 0.3 mg/dL   Indirect Bilirubin NOT CALCULATED  0.3 - 0.9 mg/dL    A:  HD#1  2548w5d with cholestasis of pregnancy with increase LFTs.  Now reassuring testing.  P: Obs o/n.  Will get MFM consult in am to see if they recommend increase in Ursodiol and for their input in delivery planning.  Pt does not have elevated BPS but will check full PIH panel in am.  NSTs q shift.  Rebeccah Ivins A

## 2013-05-02 NOTE — Progress Notes (Signed)
Pt was seen by Dr. Sherrie Georgeecker. She advised that the dose of Usidial be increased to 600mg  TID. Will discharge to home with this dose.

## 2013-05-02 NOTE — Progress Notes (Signed)
Monitors removed and pt taken to MFM for consult and u/s

## 2013-05-03 LAB — BILE ACIDS, TOTAL: Bile Acids Total: 19 umol/L (ref 0–19)

## 2013-05-03 NOTE — Discharge Summary (Signed)
Pt was admitted by Dr Henderson CloudHorvath for increased itching from Cholestasis of preg. She is on Actigall 300mg  TID. She was evaluated by Dr . Sherrie Georgeecker and the suggestion was that the pt increase the Actigall to 600mg  TID. Pt discharged with these changes. Will follow up in the office in one week.

## 2013-05-05 ENCOUNTER — Inpatient Hospital Stay (HOSPITAL_COMMUNITY)
Admission: AD | Admit: 2013-05-05 | Discharge: 2013-05-06 | Disposition: A | Discharge status: Home / Self Care | Payer: Medicaid Other | Source: Ambulatory Visit | Attending: Obstetrics and Gynecology | Admitting: Obstetrics and Gynecology

## 2013-05-05 DIAGNOSIS — M549 Dorsalgia, unspecified: Secondary | ICD-10-CM

## 2013-05-05 DIAGNOSIS — R109 Unspecified abdominal pain: Secondary | ICD-10-CM | POA: Insufficient documentation

## 2013-05-05 DIAGNOSIS — M545 Low back pain, unspecified: Secondary | ICD-10-CM | POA: Insufficient documentation

## 2013-05-05 DIAGNOSIS — O26893 Other specified pregnancy related conditions, third trimester: Secondary | ICD-10-CM

## 2013-05-05 DIAGNOSIS — O368390 Maternal care for abnormalities of the fetal heart rate or rhythm, unspecified trimester, not applicable or unspecified: Secondary | ICD-10-CM | POA: Insufficient documentation

## 2013-05-05 DIAGNOSIS — O368190 Decreased fetal movements, unspecified trimester, not applicable or unspecified: Secondary | ICD-10-CM | POA: Insufficient documentation

## 2013-05-05 DIAGNOSIS — O9989 Other specified diseases and conditions complicating pregnancy, childbirth and the puerperium: Secondary | ICD-10-CM

## 2013-05-05 DIAGNOSIS — O99891 Other specified diseases and conditions complicating pregnancy: Secondary | ICD-10-CM | POA: Insufficient documentation

## 2013-05-05 NOTE — MAU Note (Signed)
Pt G2 P1 at 34.1wks, with lower back and abd pain, pain in legs x 1hr.  Denies bleeding.

## 2013-05-06 ENCOUNTER — Encounter (HOSPITAL_COMMUNITY): Payer: Self-pay | Admitting: *Deleted

## 2013-05-06 ENCOUNTER — Other Ambulatory Visit (HOSPITAL_COMMUNITY): Payer: Self-pay | Admitting: Obstetrics and Gynecology

## 2013-05-06 DIAGNOSIS — K831 Obstruction of bile duct: Secondary | ICD-10-CM

## 2013-05-06 LAB — URINE MICROSCOPIC-ADD ON

## 2013-05-06 LAB — URINALYSIS, ROUTINE W REFLEX MICROSCOPIC
Bilirubin Urine: NEGATIVE
Glucose, UA: 100 mg/dL — AB
Hgb urine dipstick: NEGATIVE
Ketones, ur: NEGATIVE mg/dL
Nitrite: NEGATIVE
PH: 7 (ref 5.0–8.0)
Protein, ur: NEGATIVE mg/dL
Specific Gravity, Urine: <1.005 — ABNORMAL LOW (ref 1.005–1.030)
Urobilinogen, UA: 0.2 mg/dL (ref 0.0–1.0)

## 2013-05-06 MED ORDER — ACETAMINOPHEN 500 MG PO TABS
1000.0000 mg | ORAL_TABLET | Freq: Once | ORAL | Status: AC
  Administered 2013-05-06: 1000 mg via ORAL
  Filled 2013-05-06: qty 2

## 2013-05-06 NOTE — MAU Note (Addendum)
Pt reports pain in the back, abdomen and inner thighs that is coming and going since 1600. Pt reports that the pain intensified this evening when she was lying in bed. Denies leaking of fluid or vaginal bleeding.  Pt reports that she has had loose stools for the past 3 days.

## 2013-05-06 NOTE — Discharge Instructions (Signed)

## 2013-05-06 NOTE — MAU Provider Note (Signed)
History     CSN: [id number]  Arrival date and time: 05/05/13 2309   First Provider Initiated Contact with Patient 05/06/13 0032      Chief Complaint  Patient presents with  . Back Pain  . Abdominal Pain   HPI Ms. Kristin [name] is a 23 y.o. G2P1001 at [redacted]w[redacted]d who presents to MAU today with complaint of lower abdominal and low back pain. The patient states that this pain started this afternoon. She rates her pain at 8/10 now and 10/10 at the worst earlier today. She states that the pain comes and goes and feels like tightening and intense cramping. She denies vaginal bleeding, discharge or LOF. She reports somewhat decreased fetal movement tonight, but normal fetal movement earlier this evening.   OB History   Grav Para Term Preterm Abortions TAB SAB Ect Mult Living   2 1 1  0 0 0 0 0 0 1      Past Medical History  Diagnosis Date  . No pertinent past medical history   . Cholecystitis     Past Surgical History  Procedure Laterality Date  . No past surgeries      Family History  Problem Relation Age of Onset  . Diabetes Maternal Uncle   . Other Neg Hx     History  Substance Use Topics  . Smoking status: Never Smoker   . Smokeless tobacco: Never Used  . Alcohol Use: 1.2 oz/week    2 Glasses of wine per week     Comment: occasional ETOH use prior to pregnancy    Allergies: No Known Allergies  Prescriptions prior to admission  Medication Sig Dispense Refill  . hydrOXYzine (ATARAX/VISTARIL) 25 MG tablet Take 25 mg by mouth at bedtime.      . Prenatal Vit-Fe Fumarate-FA (PRENATAL MULTIVITAMIN) TABS tablet Take 1 tablet by mouth daily at 12 noon.      . ursodiol (ACTIGALL) 300 MG capsule Take 2 capsules (600 mg total) by mouth 3 (three) times daily.  180 capsule  0    Review of Systems  Constitutional: Negative for fever and malaise/fatigue.  Gastrointestinal: Positive for nausea, abdominal pain and diarrhea. Negative for vomiting and constipation.  Genitourinary:    Neg - vaginal bleeding, discharge, LOF   Physical Exam   Blood pressure 117/64, pulse 102, temperature 97.5 F (36.4 C), temperature source Oral, resp. rate 18, height 5\' 5"  (1.651 m), weight 260 lb (117.935 kg).  Physical Exam  Constitutional: She is oriented to person, place, and time. She appears well-developed and well-nourished. No distress.  HENT:  Head: Normocephalic and atraumatic.  Cardiovascular: Normal rate, regular rhythm and normal heart sounds.   Respiratory: Effort normal and breath sounds normal. No respiratory distress.  GI: Soft. Bowel sounds are normal. She exhibits no distension and no mass. There is tenderness (lower abdominal tenderness to palpation). There is no rebound and no guarding.  Neurological: She is alert and oriented to person, place, and time.  Skin: Skin is warm and dry. No erythema.  Psychiatric: She has a normal mood and affect.  Dilation: Closed Effacement (%): Thick Cervical Position: Posterior Station: Ballotable Exam by:: Naaman Plummer PA  Results for orders placed during the hospital encounter of 05/05/13 (from the past 24 hour(s))  URINALYSIS, ROUTINE W REFLEX MICROSCOPIC     Status: Abnormal   Collection Time    05/05/13 11:40 PM      Result Value Range   Color, Urine YELLOW  YELLOW   APPearance CLEAR  CLEAR   Specific Gravity, Urine <1.005 (*) 1.005 - 1.030   pH 7.0  5.0 - 8.0   Glucose, UA 100 (*) NEGATIVE mg/dL   Hgb urine dipstick NEGATIVE  NEGATIVE   Bilirubin Urine NEGATIVE  NEGATIVE   Ketones, ur NEGATIVE  NEGATIVE mg/dL   Protein, ur NEGATIVE  NEGATIVE mg/dL   Urobilinogen, UA 0.2  0.0 - 1.0 mg/dL   Nitrite NEGATIVE  NEGATIVE   Leukocytes, UA SMALL (*) NEGATIVE  URINE MICROSCOPIC-ADD ON     Status: None   Collection Time    05/05/13 11:40 PM      Result Value Range   Squamous Epithelial / LPF RARE  RARE   WBC, UA 0-2  <3 WBC/hpf   RBC / HPF 0-2  <3 RBC/hpf   Bacteria, UA RARE  RARE   Fetal Monitoring: Baseline: 140 bpm,  moderate variability, + accelerations, variable decelerations Contractions: irregular, occasional MAU Course  Procedures None  MDM Discussed with Dr. Claiborne Billingsallahan. Ok to given Tylenol here today Advised abdominal support belt, Tylenol PRN Follow-up in the office tomorrow for NST  Assessment and Plan  A: Back pain in pregnancy  P: Discharge home Advised abdominal binder and Tylenol PRN Patient encouraged to keep appointment tomorrow in the office for twice weekly testing Patient may return to MAU as needed or if her condition were to change or worsen  Freddi StarrJulie N Ethier, PA-C  05/06/2013, 12:32 AM

## 2013-05-07 ENCOUNTER — Inpatient Hospital Stay (HOSPITAL_COMMUNITY): Payer: Medicaid Other

## 2013-05-07 ENCOUNTER — Encounter (HOSPITAL_COMMUNITY): Payer: Self-pay | Admitting: *Deleted

## 2013-05-07 ENCOUNTER — Inpatient Hospital Stay (HOSPITAL_COMMUNITY)
Admission: AD | Admit: 2013-05-07 | Discharge: 2013-05-07 | Disposition: A | Discharge status: Home / Self Care | Payer: Medicaid Other | Source: Ambulatory Visit | Attending: Obstetrics and Gynecology | Admitting: Obstetrics and Gynecology

## 2013-05-07 DIAGNOSIS — O99891 Other specified diseases and conditions complicating pregnancy: Secondary | ICD-10-CM

## 2013-05-07 DIAGNOSIS — O9989 Other specified diseases and conditions complicating pregnancy, childbirth and the puerperium: Secondary | ICD-10-CM

## 2013-05-07 DIAGNOSIS — O4700 False labor before 37 completed weeks of gestation, unspecified trimester: Secondary | ICD-10-CM | POA: Insufficient documentation

## 2013-05-07 DIAGNOSIS — R109 Unspecified abdominal pain: Secondary | ICD-10-CM | POA: Insufficient documentation

## 2013-05-07 DIAGNOSIS — O4690 Antepartum hemorrhage, unspecified, unspecified trimester: Secondary | ICD-10-CM | POA: Insufficient documentation

## 2013-05-07 DIAGNOSIS — N898 Other specified noninflammatory disorders of vagina: Secondary | ICD-10-CM

## 2013-05-07 DIAGNOSIS — O26893 Other specified pregnancy related conditions, third trimester: Secondary | ICD-10-CM

## 2013-05-07 DIAGNOSIS — Z3689 Encounter for other specified antenatal screening: Secondary | ICD-10-CM

## 2013-05-07 DIAGNOSIS — O26859 Spotting complicating pregnancy, unspecified trimester: Secondary | ICD-10-CM

## 2013-05-07 LAB — AMNISURE RUPTURE OF MEMBRANE (ROM) NOT AT ARMC: Amnisure ROM: NEGATIVE

## 2013-05-07 NOTE — MAU Note (Addendum)
Noted bleeding when got up this morning around 0600, bright red, soaked through panties. No blots.  Has been cramping..  No hx of previa or low lying placenta

## 2013-05-07 NOTE — MAU Provider Note (Signed)
History     CSN: [id number]  Arrival date and time: 05/07/13 [id number]   First Provider Initiated Contact with Patient 05/07/13 1810      Chief Complaint  Patient presents with  . Vaginal Bleeding   HPI  Kristin [name] is a 23 y.o. female G2P1001 at 8529w3d who present with compaints of bright red vaginal bleeding; the discharge was very liquid like. The bleeding stopped however throughout the day the patient felt "wet". The bleeding is described at bright red and very "liguidy".  No intercourse recently.  Has some mild abdominal cramping that is not new.  She reports good fetal movement, denies LOF, vaginal bleeding (currently), vaginal itching/burning, urinary symptoms, h/a, dizziness, n/v, or fever/chills.    OB History   Grav Para Term Preterm Abortions TAB SAB Ect Mult Living   2 1 1  0 0 0 0 0 0 1      Past Medical History  Diagnosis Date  . No pertinent past medical history   . Cholecystitis     Past Surgical History  Procedure Laterality Date  . No past surgeries      Family History  Problem Relation Age of Onset  . Diabetes Maternal Uncle   . Other Neg Hx     History  Substance Use Topics  . Smoking status: Never Smoker   . Smokeless tobacco: Never Used  . Alcohol Use: 1.2 oz/week    2 Glasses of wine per week     Comment: occasional ETOH use prior to pregnancy    Allergies: No Known Allergies  Prescriptions prior to admission  Medication Sig Dispense Refill  . hydrOXYzine (ATARAX/VISTARIL) 25 MG tablet Take 25 mg by mouth at bedtime.      . Prenatal Vit-Fe Fumarate-FA (PRENATAL MULTIVITAMIN) TABS tablet Take 1 tablet by mouth daily at 12 noon.      . ursodiol (ACTIGALL) 300 MG capsule Take 2 capsules (600 mg total) by mouth 3 (three) times daily.  180 capsule  0   Results for orders placed during the hospital encounter of 05/07/13 (from the past 48 hour(s))  AMNISURE RUPTURE OF MEMBRANE (ROM)     Status: None   Collection Time    05/07/13  6:58 PM   Result Value Range   Amnisure ROM NEGATIVE     Results for orders placed during the hospital encounter of 05/07/13 (from the past 48 hour(s))  AMNISURE RUPTURE OF MEMBRANE (ROM)     Status: None   Collection Time    05/07/13  6:58 PM      Result Value Range   Amnisure ROM NEGATIVE       Review of Systems  Constitutional: Negative for fever and chills.  Gastrointestinal: Positive for abdominal pain. Negative for heartburn, nausea, vomiting, diarrhea and constipation.       Lower abdominal cramping   Genitourinary: Negative for dysuria, urgency, frequency, hematuria and flank pain.  Musculoskeletal: Positive for back pain.   Physical Exam   Blood pressure 121/67, pulse 88, temperature 98.2 F (36.8 C), temperature source Oral, resp. rate 18, height 5\' 5"  (1.651 m), weight 117.935 kg (260 lb).  Physical Exam  Constitutional: She is oriented to person, place, and time. She appears well-developed and well-nourished. No distress.  HENT:  Head: Normocephalic.  Eyes: Pupils are equal, round, and reactive to light.  Neck: Neck supple.  Respiratory: Effort normal.  GI: Soft. She exhibits no distension and no mass. There is no tenderness. There is no rebound and  no guarding.  Genitourinary:  Speculum exam: Vagina - Small amount of creamy discharge, no odor, no pooling of fluid in the vagina  Cervix - No contact bleeding, no active bleeding. Bimanual exam: Cervix FT, Thick, posterior  Uterus non tender, gravid  Adnexa non tender, no masses bilaterally Chaperone present for exam.   Musculoskeletal: Normal range of motion.  Neurological: She is alert and oriented to person, place, and time.  Skin: Skin is warm. She is not diaphoretic.  Psychiatric: Her behavior is normal.   Fetal Tracing: Baseline: 145 bpm Variability: Moderate  Accelerations: one 15x15 with 10 x10 accels Decelerations: None Toco: Irregular    MAU Course  Procedures None  MDM NST Fern slide negative for  ROM  Amnisure negative  Reassuring fetal strip; not reactive. Dr. Tenny Craw informed; BPP ordered.  Preliminary [place] report shows 8/8 BPP results. AFI 15.83 cm  With largest pocket 6.63 cm. No placental abruption or previa identified   Assessment and Plan   A:  Non reactive fetal tracing; reassuring tracing;  8/8 bPP  Vaginal discharge in pregnancy   P:  Discharge home in stable condition Return to MAU with worsening symptoms Kick counts Preterm labor precautions Call the office with any further vaginal bleeding  [name] Rasch, NP  05/07/2013, 9:12 PM

## 2013-05-08 ENCOUNTER — Ambulatory Visit (HOSPITAL_COMMUNITY)
Admission: RE | Admit: 2013-05-08 | Discharge: 2013-05-08 | Disposition: A | Discharge status: Home / Self Care | Payer: Medicaid Other | Source: Ambulatory Visit | Attending: Obstetrics and Gynecology | Admitting: Obstetrics and Gynecology

## 2013-05-12 ENCOUNTER — Inpatient Hospital Stay (HOSPITAL_COMMUNITY)
Admission: AD | Admit: 2013-05-12 | Discharge: 2013-05-12 | Disposition: A | Discharge status: Home / Self Care | Payer: Medicaid Other | Source: Ambulatory Visit | Attending: Obstetrics and Gynecology | Admitting: Obstetrics and Gynecology

## 2013-05-12 ENCOUNTER — Encounter (HOSPITAL_COMMUNITY): Payer: Self-pay

## 2013-05-12 DIAGNOSIS — O99891 Other specified diseases and conditions complicating pregnancy: Secondary | ICD-10-CM | POA: Insufficient documentation

## 2013-05-12 DIAGNOSIS — O26899 Other specified pregnancy related conditions, unspecified trimester: Secondary | ICD-10-CM

## 2013-05-12 DIAGNOSIS — N898 Other specified noninflammatory disorders of vagina: Secondary | ICD-10-CM

## 2013-05-12 DIAGNOSIS — O4700 False labor before 37 completed weeks of gestation, unspecified trimester: Secondary | ICD-10-CM

## 2013-05-12 DIAGNOSIS — O9989 Other specified diseases and conditions complicating pregnancy, childbirth and the puerperium: Secondary | ICD-10-CM

## 2013-05-12 LAB — AMNISURE RUPTURE OF MEMBRANE (ROM) NOT AT ARMC: AMNISURE: NEGATIVE

## 2013-05-12 NOTE — MAU Note (Signed)
Pt states this am woke up at 0640 and felt gush of fluid, sheets were wet. Got up and walked around then felt more fluid leaking. Denies issues with pregnancy except cholestasis.

## 2013-05-12 NOTE — Discharge Instructions (Signed)

## 2013-05-12 NOTE — MAU Provider Note (Signed)
Chief Complaint:  Rupture of Membranes   First Provider Initiated Contact with Patient 05/12/13 [phone]      HPI: Kristin [name] is a 23 y.o. G2P1001 at 335w1dwho presents to maternity admissions reporting leaking of clear fluid x 2 episodes this am.  She describes the fluid as clear, a small amount on her underwear and on the bed, with no leaking in between the two episodes.  She reports good fetal movement, denies regular contractions, vaginal bleeding, vaginal itching/burning, urinary symptoms, h/a, dizziness, n/v, or fever/chills.     Past Medical History: Past Medical History  Diagnosis Date  . No pertinent past medical history   . Cholecystitis     Past obstetric history: OB History  Gravida Para Term Preterm AB SAB TAB Ectopic Multiple Living  2 1 1  0 0 0 0 0 0 1    # Outcome Date GA Lbr Len/2nd Weight Sex Delivery Anes PTL Lv  2 CUR           1 TRM 12/19/11 6565w3d 06:04 / 00:29 2.62 kg (5 lb 12.4 oz) F SVD EPI  Y      Past Surgical History: Past Surgical History  Procedure Laterality Date  . No past surgeries      Family History: Family History  Problem Relation Age of Onset  . Diabetes Maternal Uncle   . Other Neg Hx     Social History: History  Substance Use Topics  . Smoking status: Never Smoker   . Smokeless tobacco: Never Used  . Alcohol Use: No     Comment: occasional ETOH use prior to pregnancy    Allergies: No Known Allergies  Meds:  Prescriptions prior to admission  Medication Sig Dispense Refill  . hydrOXYzine (ATARAX/VISTARIL) 25 MG tablet Take 25 mg by mouth at bedtime.      . Prenatal Vit-Fe Fumarate-FA (PRENATAL MULTIVITAMIN) TABS tablet Take 1 tablet by mouth daily at 12 noon.      . ursodiol (ACTIGALL) 300 MG capsule Take 2 capsules (600 mg total) by mouth 3 (three) times daily.  180 capsule  0    ROS: Pertinent findings in history of present illness.  Physical Exam  Blood pressure 119/70, pulse 96, temperature 97.9 F (36.6 C),  temperature source Oral, resp. rate 16, not currently breastfeeding. GENERAL: Well-developed, well-nourished female in no acute distress.  HEENT: normocephalic HEART: normal rate RESP: normal effort ABDOMEN: Soft, non-tender, gravid appropriate for gestational age EXTREMITIES: Nontender, no edema NEURO: alert and oriented Pelvic exam: Cervix pink, visually closed, without lesion, small amount white mucus-like discharge, negative pooling with cough/bearing down, vaginal walls and external genitalia normal  Negative Ferning  Dilation: 1 Effacement (%): 40 Cervical Position: Posterior Station:  (high) Exam by:: Leftwich-Kirby, CNM  FHT:  Baseline 140 , moderate variability, accelerations present, variable deceleration x1 lasting 40 seconds down to 90 during contraction, accelerations before and after this isolated decel Contractions: occasional, mild to palpation   Labs: Results for orders placed during the hospital encounter of 05/12/13 (from the past 24 hour(s))  AMNISURE RUPTURE OF MEMBRANE (ROM)     Status: None   Collection Time    05/12/13  9:10 AM      Result Value Range   Amnisure ROM NEGATIVE       Assessment: 1. Vaginal discharge in pregnancy   2. Threatened preterm labor     Plan: [facility]oss[facility] Discharge home Labor precautions and fetal kick counts Keep scheduled appointments in the  office Return to MAU as needed     Follow-up Information   [facility] (Keep scheduled appointments in the office. Return to MAU as needed.)    Contact information:   [address] [place] [address] [phone]       Medication List         hydrOXYzine 25 MG tablet  Commonly known as:  ATARAX/VISTARIL  Take 25 mg by mouth at bedtime.     prenatal multivitamin Tabs tablet  Take 1 tablet by mouth daily at 12 noon.     ursodiol 300 MG capsule  Commonly known as:   ACTIGALL  Take 2 capsules (600 mg total) by mouth 3 (three) times daily.        Sharen Counter Certified Nurse-Midwife 05/12/2013 10:21 AM

## 2013-05-12 NOTE — MAU Note (Signed)
Started leaking at 0600, clear fluid still coming- "back of pants are wet", no bleeding, kind of cramping.

## 2013-05-16 ENCOUNTER — Other Ambulatory Visit (HOSPITAL_COMMUNITY): Payer: Self-pay | Admitting: Obstetrics & Gynecology

## 2013-05-16 DIAGNOSIS — O288 Other abnormal findings on antenatal screening of mother: Secondary | ICD-10-CM

## 2013-05-16 LAB — OB RESULTS CONSOLE GBS: GBS: POSITIVE

## 2013-05-19 ENCOUNTER — Encounter (HOSPITAL_COMMUNITY): Payer: Self-pay | Admitting: *Deleted

## 2013-05-19 ENCOUNTER — Ambulatory Visit (HOSPITAL_COMMUNITY)
Admission: RE | Admit: 2013-05-19 | Discharge: 2013-05-19 | Disposition: A | Discharge status: Home / Self Care | Payer: BC Managed Care – PPO | Source: Ambulatory Visit | Attending: Obstetrics & Gynecology | Admitting: Obstetrics & Gynecology

## 2013-05-19 ENCOUNTER — Inpatient Hospital Stay (HOSPITAL_COMMUNITY): Admission: RE | Admit: 2013-05-19 | Payer: Medicaid Other | Source: Ambulatory Visit

## 2013-05-19 ENCOUNTER — Inpatient Hospital Stay (HOSPITAL_COMMUNITY): Payer: BC Managed Care – PPO

## 2013-05-19 ENCOUNTER — Inpatient Hospital Stay (HOSPITAL_COMMUNITY)
Admission: AD | Admit: 2013-05-19 | Discharge: 2013-05-19 | Disposition: A | Discharge status: Home / Self Care | Payer: BC Managed Care – PPO | Source: Ambulatory Visit | Attending: Obstetrics and Gynecology | Admitting: Obstetrics and Gynecology

## 2013-05-19 ENCOUNTER — Other Ambulatory Visit (HOSPITAL_COMMUNITY): Payer: Self-pay | Admitting: Obstetrics and Gynecology

## 2013-05-19 DIAGNOSIS — O288 Other abnormal findings on antenatal screening of mother: Secondary | ICD-10-CM

## 2013-05-19 DIAGNOSIS — E669 Obesity, unspecified: Secondary | ICD-10-CM | POA: Insufficient documentation

## 2013-05-19 DIAGNOSIS — O368390 Maternal care for abnormalities of the fetal heart rate or rhythm, unspecified trimester, not applicable or unspecified: Secondary | ICD-10-CM

## 2013-05-19 DIAGNOSIS — O289 Unspecified abnormal findings on antenatal screening of mother: Secondary | ICD-10-CM | POA: Insufficient documentation

## 2013-05-19 DIAGNOSIS — O99210 Obesity complicating pregnancy, unspecified trimester: Principal | ICD-10-CM

## 2013-05-19 LAB — URINALYSIS, ROUTINE W REFLEX MICROSCOPIC
Bilirubin Urine: NEGATIVE
Glucose, UA: 250 mg/dL — AB
Hgb urine dipstick: NEGATIVE
Ketones, ur: NEGATIVE mg/dL
NITRITE: NEGATIVE
PROTEIN: NEGATIVE mg/dL
SPECIFIC GRAVITY, URINE: 1.025 (ref 1.005–1.030)
UROBILINOGEN UA: 0.2 mg/dL (ref 0.0–1.0)
pH: 6 (ref 5.0–8.0)

## 2013-05-19 LAB — URINE MICROSCOPIC-ADD ON

## 2013-05-19 NOTE — MAU Provider Note (Signed)
History     CSN: [id number]  Arrival date and time: 05/19/13 1302   First Provider Initiated Contact with Patient 05/19/13 1330      No chief complaint on file.  HPI  Ms. Kristin [name] is a 23 y.o. female G2P1001 at [redacted]w[redacted]d who presents with non-reactive fetal tracing in the office. The patient has weekly NST's and AFI's. Pt was seen this morning for AFI and went to the office for an NST that was non-reactive. She was sent back over here for further monitoring and for a BPP. She reports good fetal movement, denies LOF, vaginal bleeding, vaginal itching/burning, urinary symptoms, h/a, dizziness, n/v, or fever/chills.    OB History   Grav Para Term Preterm Abortions TAB SAB Ect Mult Living   2 1 1  0 0 0 0 0 0 1      Past Medical History  Diagnosis Date  . No pertinent past medical history   . Cholecystitis     Past Surgical History  Procedure Laterality Date  . No past surgeries      Family History  Problem Relation Age of Onset  . Diabetes Maternal Uncle   . Other Neg Hx     History  Substance Use Topics  . Smoking status: Never Smoker   . Smokeless tobacco: Never Used  . Alcohol Use: No     Comment: occasional ETOH use prior to pregnancy    Allergies: No Known Allergies  Prescriptions prior to admission  Medication Sig Dispense Refill  . acetaminophen (TYLENOL) 500 MG tablet Take 1,000 mg by mouth every 8 (eight) hours as needed for moderate pain.      . calcium carbonate (TUMS - DOSED IN MG ELEMENTAL CALCIUM) 500 MG chewable tablet Chew 1-2 tablets by mouth every 4 (four) hours as needed for indigestion or heartburn.      . hydrOXYzine (ATARAX/VISTARIL) 25 MG tablet Take 25 mg by mouth at bedtime.      . Prenatal Vit-Fe Fumarate-FA (PRENATAL MULTIVITAMIN) TABS tablet Take 1 tablet by mouth daily at 12 noon.      . ursodiol (ACTIGALL) 300 MG capsule Take 2 capsules (600 mg total) by mouth 3 (three) times daily.  180 capsule  0    Review of Systems   Constitutional: Negative for fever and chills.  Gastrointestinal: Negative for nausea, vomiting, abdominal pain, diarrhea and constipation.  Genitourinary: Negative for dysuria, urgency, frequency and hematuria.       No vaginal discharge. No vaginal bleeding. No dysuria.   Musculoskeletal: Negative for back pain.   Physical Exam   Blood pressure 111/63, pulse 124, temperature 98.4 F (36.9 C), temperature source Oral, resp. rate 18, weight 118.842 kg (262 lb), SpO2 100.00%, not currently breastfeeding.  Physical Exam  Constitutional: She is oriented to person, place, and time. She appears well-developed and well-nourished. No distress.  HENT:  Head: Normocephalic.  Eyes: Pupils are equal, round, and reactive to light.  Neck: Neck supple.  Respiratory: Effort normal.  Musculoskeletal: Normal range of motion.  Neurological: She is alert and oriented to person, place, and time.  Skin: Skin is warm. She is not diaphoretic.  Psychiatric: Her behavior is normal.    Fetal Tracing:  Baseline: 145 bpm  Variability: Moderate  Accelerations: 2 15x15 accelerations.  Decelerations: None Toco: UI    MAU Course  Procedures None  MDM NST 1335Consulted with Dr. Claiborne Billings; pt to [facility] for [facility]  BPP 1435: BPP preliminary shows 8/8; Dr. Claiborne Billings made aware. Pt to  be discharged home.   Assessment and Plan   A:  1. Non-reassuring electronic fetal monitoring tracing   2.  BPP 8/8  P:  Discharge home in stable condition Pt is scheduled to be induced on Sunday.  Kick counts discussed Keep your follow up appointment with primary Dr.   Iona HansenJennifer Irene Jamarrion Budai, NP  05/19/2013, 3:36 PM

## 2013-05-19 NOTE — MAU Note (Signed)
[facility] Delray [facility] Here for further monitoring.

## 2013-05-21 ENCOUNTER — Encounter (HOSPITAL_COMMUNITY): Payer: Self-pay | Admitting: *Deleted

## 2013-05-21 ENCOUNTER — Telehealth (HOSPITAL_COMMUNITY): Payer: Self-pay | Admitting: *Deleted

## 2013-05-21 NOTE — Telephone Encounter (Signed)
Preadmission screen  

## 2013-05-25 ENCOUNTER — Encounter (HOSPITAL_COMMUNITY): Payer: Self-pay

## 2013-05-25 ENCOUNTER — Inpatient Hospital Stay (HOSPITAL_COMMUNITY)
Admission: RE | Admit: 2013-05-25 | Discharge: 2013-05-27 | DRG: 775 | Disposition: A | Discharge status: Home / Self Care | Payer: Medicaid Other | Source: Ambulatory Visit | Attending: Obstetrics & Gynecology | Admitting: Obstetrics & Gynecology

## 2013-05-25 DIAGNOSIS — K831 Obstruction of bile duct: Secondary | ICD-10-CM | POA: Diagnosis present

## 2013-05-25 DIAGNOSIS — Z22330 Carrier of Group B streptococcus: Secondary | ICD-10-CM

## 2013-05-25 DIAGNOSIS — K838 Other specified diseases of biliary tract: Secondary | ICD-10-CM | POA: Diagnosis present

## 2013-05-25 DIAGNOSIS — O26619 Liver and biliary tract disorders in pregnancy, unspecified trimester: Principal | ICD-10-CM | POA: Diagnosis present

## 2013-05-25 DIAGNOSIS — O0930 Supervision of pregnancy with insufficient antenatal care, unspecified trimester: Secondary | ICD-10-CM

## 2013-05-25 DIAGNOSIS — O9989 Other specified diseases and conditions complicating pregnancy, childbirth and the puerperium: Secondary | ICD-10-CM

## 2013-05-25 DIAGNOSIS — O99892 Other specified diseases and conditions complicating childbirth: Secondary | ICD-10-CM | POA: Diagnosis present

## 2013-05-25 LAB — COMPREHENSIVE METABOLIC PANEL
ALBUMIN: 2.3 g/dL — AB (ref 3.5–5.2)
ALT: 65 U/L — ABNORMAL HIGH (ref 0–35)
AST: 47 U/L — ABNORMAL HIGH (ref 0–37)
Alkaline Phosphatase: 135 U/L — ABNORMAL HIGH (ref 39–117)
BILIRUBIN TOTAL: 0.3 mg/dL (ref 0.3–1.2)
BUN: 4 mg/dL — AB (ref 6–23)
CHLORIDE: 101 meq/L (ref 96–112)
CO2: 23 mEq/L (ref 19–32)
CREATININE: 0.52 mg/dL (ref 0.50–1.10)
Calcium: 9.2 mg/dL (ref 8.4–10.5)
GFR calc Af Amer: >90 mL/min (ref >90)
Glucose, Bld: 95 mg/dL (ref 70–99)
Potassium: 4.1 mEq/L (ref 3.7–5.3)
Sodium: 136 mEq/L — ABNORMAL LOW (ref 137–147)
Total Protein: 6.5 g/dL (ref 6.0–8.3)

## 2013-05-25 LAB — CBC
HCT: 34.6 % — ABNORMAL LOW (ref 36.0–46.0)
Hemoglobin: 11.9 g/dL — ABNORMAL LOW (ref 12.0–15.0)
MCH: 30.1 pg (ref 26.0–34.0)
MCHC: 34.4 g/dL (ref 30.0–36.0)
MCV: 87.6 fL (ref 78.0–100.0)
PLATELETS: 227 10*3/uL (ref 150–400)
RBC: 3.95 MIL/uL (ref 3.87–5.11)
RDW: 14.6 % (ref 11.5–15.5)
WBC: 7.5 10*3/uL (ref 4.0–10.5)

## 2013-05-25 LAB — TYPE AND SCREEN
ABO/RH(D): O POS
Antibody Screen: NEGATIVE

## 2013-05-25 MED ORDER — BUTORPHANOL TARTRATE 1 MG/ML IJ SOLN: 1.0000 mg | INTRAMUSCULAR | Status: DC | PRN

## 2013-05-25 MED ORDER — DEXTROSE 5 % IV SOLN
2.5000 10*6.[IU] | INTRAVENOUS | Status: DC
  Administered 2013-05-25 – 2013-05-26 (×4): 2.5 10*6.[IU] via INTRAVENOUS
  Filled 2013-05-25 (×8): qty 2.5

## 2013-05-25 MED ORDER — LIDOCAINE HCL (PF) 1 % IJ SOLN
30.0000 mL | INTRAMUSCULAR | Status: AC | PRN
  Administered 2013-05-26 (×2): 5 mL via SUBCUTANEOUS

## 2013-05-25 MED ORDER — OXYCODONE-ACETAMINOPHEN 5-325 MG PO TABS: 1.0000 | ORAL_TABLET | ORAL | Status: DC | PRN

## 2013-05-25 MED ORDER — OXYTOCIN BOLUS FROM INFUSION: 500.0000 mL | INTRAVENOUS | Status: DC

## 2013-05-25 MED ORDER — ZOLPIDEM TARTRATE 5 MG PO TABS: 5.0000 mg | ORAL_TABLET | Freq: Every evening | ORAL | Status: DC | PRN

## 2013-05-25 MED ORDER — LACTATED RINGERS IV SOLN
500.0000 mL | INTRAVENOUS | Status: DC | PRN
  Administered 2013-05-26: 1000 mL via INTRAVENOUS

## 2013-05-25 MED ORDER — PENICILLIN G POTASSIUM 5000000 UNITS IJ SOLR
5.0000 10*6.[IU] | Freq: Once | INTRAVENOUS | Status: AC
  Administered 2013-05-25: 5 10*6.[IU] via INTRAVENOUS
  Filled 2013-05-25: qty 5

## 2013-05-25 MED ORDER — CITRIC ACID-SODIUM CITRATE 334-500 MG/5ML PO SOLN: 30.0000 mL | ORAL | Status: DC | PRN

## 2013-05-25 MED ORDER — LACTATED RINGERS IV SOLN
INTRAVENOUS | Status: DC
  Administered 2013-05-25 – 2013-05-26 (×2): via INTRAVENOUS
  Administered 2013-05-26: 1000 mL via INTRAVENOUS
  Administered 2013-05-26: 05:00:00 via INTRAVENOUS

## 2013-05-25 MED ORDER — IBUPROFEN 600 MG PO TABS: 600.0000 mg | ORAL_TABLET | Freq: Four times a day (QID) | ORAL | Status: DC | PRN

## 2013-05-25 MED ORDER — ONDANSETRON HCL 4 MG/2ML IJ SOLN
4.0000 mg | Freq: Four times a day (QID) | INTRAMUSCULAR | Status: DC | PRN
  Administered 2013-05-26: 4 mg via INTRAVENOUS
  Filled 2013-05-25 (×2): qty 2

## 2013-05-25 MED ORDER — OXYTOCIN 40 UNITS IN LACTATED RINGERS INFUSION - SIMPLE MED: 62.5000 mL/h | INTRAVENOUS | Status: DC

## 2013-05-25 MED ORDER — ACETAMINOPHEN 325 MG PO TABS
650.0000 mg | ORAL_TABLET | ORAL | Status: DC | PRN
  Administered 2013-05-25: 650 mg via ORAL
  Filled 2013-05-25: qty 2

## 2013-05-25 MED ORDER — TERBUTALINE SULFATE 1 MG/ML IJ SOLN: 0.2500 mg | Freq: Once | INTRAMUSCULAR | Status: AC | PRN

## 2013-05-25 MED ORDER — MISOPROSTOL 25 MCG QUARTER TABLET
25.0000 ug | ORAL_TABLET | ORAL | Status: DC | PRN
  Administered 2013-05-25 – 2013-05-26 (×3): 25 ug via VAGINAL
  Filled 2013-05-25 (×2): qty 0.25
  Filled 2013-05-25: qty 1
  Filled 2013-05-25: qty 0.25
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
## 2013-05-26 ENCOUNTER — Inpatient Hospital Stay (HOSPITAL_COMMUNITY): Payer: Medicaid Other | Admitting: Anesthesiology

## 2013-05-26 ENCOUNTER — Encounter (HOSPITAL_COMMUNITY): Payer: Medicaid Other | Admitting: Anesthesiology

## 2013-05-26 ENCOUNTER — Encounter (HOSPITAL_COMMUNITY): Payer: Self-pay

## 2013-05-26 LAB — RPR: RPR Ser Ql: NONREACTIVE

## 2013-05-26 MED ORDER — TETANUS-DIPHTH-ACELL PERTUSSIS 5-2.5-18.5 LF-MCG/0.5 IM SUSP: 0.5000 mL | Freq: Once | INTRAMUSCULAR | Status: DC

## 2013-05-26 MED ORDER — LANOLIN HYDROUS EX OINT: TOPICAL_OINTMENT | CUTANEOUS | Status: DC | PRN

## 2013-05-26 MED ORDER — SENNOSIDES-DOCUSATE SODIUM 8.6-50 MG PO TABS
2.0000 | ORAL_TABLET | ORAL | Status: DC
  Administered 2013-05-26: 2 via ORAL
  Filled 2013-05-26: qty 2

## 2013-05-26 MED ORDER — ONDANSETRON HCL 4 MG PO TABS: 4.0000 mg | ORAL_TABLET | ORAL | Status: DC | PRN

## 2013-05-26 MED ORDER — OXYTOCIN 40 UNITS IN LACTATED RINGERS INFUSION - SIMPLE MED
1.0000 m[IU]/min | INTRAVENOUS | Status: DC
  Administered 2013-05-26: 8 m[IU]/min via INTRAVENOUS
  Administered 2013-05-26: 2 m[IU]/min via INTRAVENOUS
  Filled 2013-05-26: qty 1000

## 2013-05-26 MED ORDER — SIMETHICONE 80 MG PO CHEW: 80.0000 mg | CHEWABLE_TABLET | ORAL | Status: DC | PRN

## 2013-05-26 MED ORDER — PHENYLEPHRINE 40 MCG/ML (10ML) SYRINGE FOR IV PUSH (FOR BLOOD PRESSURE SUPPORT)
80.0000 ug | PREFILLED_SYRINGE | INTRAVENOUS | Status: DC | PRN
  Filled 2013-05-26: qty 2

## 2013-05-26 MED ORDER — DIBUCAINE 1 % RE OINT: 1.0000 "application " | TOPICAL_OINTMENT | RECTAL | Status: DC | PRN

## 2013-05-26 MED ORDER — OXYCODONE-ACETAMINOPHEN 5-325 MG PO TABS
1.0000 | ORAL_TABLET | ORAL | Status: DC | PRN
  Administered 2013-05-26: 1 via ORAL
  Filled 2013-05-26: qty 1

## 2013-05-26 MED ORDER — WITCH HAZEL-GLYCERIN EX PADS: 1.0000 "application " | MEDICATED_PAD | CUTANEOUS | Status: DC | PRN

## 2013-05-26 MED ORDER — BENZOCAINE-MENTHOL 20-0.5 % EX AERO
1.0000 "application " | INHALATION_SPRAY | CUTANEOUS | Status: DC | PRN
  Administered 2013-05-26: 1 via TOPICAL
  Filled 2013-05-26: qty 56

## 2013-05-26 MED ORDER — PHENYLEPHRINE 40 MCG/ML (10ML) SYRINGE FOR IV PUSH (FOR BLOOD PRESSURE SUPPORT)
PREFILLED_SYRINGE | INTRAVENOUS | Status: AC
  Filled 2013-05-26: qty 10

## 2013-05-26 MED ORDER — LACTATED RINGERS IV SOLN: 500.0000 mL | Freq: Once | INTRAVENOUS | Status: DC

## 2013-05-26 MED ORDER — PRENATAL MULTIVITAMIN CH
1.0000 | ORAL_TABLET | Freq: Every day | ORAL | Status: DC
  Administered 2013-05-27: 1 via ORAL
  Filled 2013-05-26: qty 1

## 2013-05-26 MED ORDER — DIPHENHYDRAMINE HCL 50 MG/ML IJ SOLN: 12.5000 mg | INTRAMUSCULAR | Status: DC | PRN

## 2013-05-26 MED ORDER — FENTANYL 2.5 MCG/ML BUPIVACAINE 1/10 % EPIDURAL INFUSION (WH - ANES)
14.0000 mL/h | INTRAMUSCULAR | Status: DC | PRN
  Administered 2013-05-26: 14 mL/h via EPIDURAL

## 2013-05-26 MED ORDER — EPHEDRINE 5 MG/ML INJ
10.0000 mg | INTRAVENOUS | Status: DC | PRN
  Filled 2013-05-26: qty 2

## 2013-05-26 MED ORDER — DIPHENHYDRAMINE HCL 25 MG PO CAPS: 25.0000 mg | ORAL_CAPSULE | Freq: Four times a day (QID) | ORAL | Status: DC | PRN

## 2013-05-26 MED ORDER — TERBUTALINE SULFATE 1 MG/ML IJ SOLN: 0.2500 mg | Freq: Once | INTRAMUSCULAR | Status: AC | PRN

## 2013-05-26 MED ORDER — FENTANYL 2.5 MCG/ML BUPIVACAINE 1/10 % EPIDURAL INFUSION (WH - ANES)
INTRAMUSCULAR | Status: AC
  Filled 2013-05-26: qty 125

## 2013-05-26 MED ORDER — IBUPROFEN 600 MG PO TABS
600.0000 mg | ORAL_TABLET | Freq: Four times a day (QID) | ORAL | Status: DC
  Administered 2013-05-26 – 2013-05-27 (×4): 600 mg via ORAL
  Filled 2013-05-26 (×4): qty 1

## 2013-05-26 MED ORDER — ONDANSETRON HCL 4 MG/2ML IJ SOLN: 4.0000 mg | INTRAMUSCULAR | Status: DC | PRN

## 2013-05-26 MED ORDER — EPHEDRINE 5 MG/ML INJ
INTRAVENOUS | Status: AC
  Filled 2013-05-26: qty 4

## 2013-05-26 MED ORDER — ZOLPIDEM TARTRATE 5 MG PO TABS: 5.0000 mg | ORAL_TABLET | Freq: Every evening | ORAL | Status: DC | PRN

## 2013-05-26 NOTE — Lactation Note (Signed)
This note was copied from the chart of Kristin [name]. Lactation Consultation Note  Patient Name: Kristin Kristin MessingMiresha Senat [id number]'s Date: 05/26/2013 Reason for consult: Initial assessment;Late preterm infant;Infant < 6lbs.  Baby is rooting and laying on her back due to mom c/o uterine ctx pain and FOB in bathroom briefly at start of visit.  Mom able to change a wet/dirty diaper and with brief assistance, baby latched well to her (R) breast in football position after brief chin tug and colostrum expressed on mom's nipple to encourage baby to open wide.  Mom breastfed first child for 14 months (now 5017 months old).  Older baby did require some early supplementation due to small size at birth and slow weight gain.  LC discussed importance of cue feeding but at least every 3 hours for this newborn due to late preterm status and weight below 6 lbs at birth.  Baby has vigorous sucking pattern but mom needs review of basic BF information. LC encouraged review of Baby and Me pp 9, 14 and 20-25 for STS and BF information. LC provided [facility] Resource brochure and reviewed [facility] services and list of community and web site resources.     Maternal Data Formula Feeding for Exclusion: No Infant to breast within first hour of birth: Yes (initial LATCH score=10; baby breastfed for 20 minutes) Has patient been taught Hand Expression?: Yes (shown by RN, Waynetta SandyBeth and reinforced by [facility]) Does the patient have breastfeeding experience prior to this delivery?: Yes  Feeding Feeding Type: Breast Fed Length of feed: 40 min  LATCH Score/Interventions Latch: Grasps breast easily, tongue down, lips flanged, rhythmical sucking. (demonstrated chin tug technique to ensure deeper latch) Intervention(s): Adjust position;Assist with latch;Breast compression  Audible Swallowing: Spontaneous and intermittent Intervention(s): Skin to skin;Hand expression;Alternate breast massage  Type of Nipple: Everted at rest and after stimulation  Comfort  (Breast/Nipple): Soft / non-tender     Hold (Positioning): Assistance needed to correctly position infant at breast and maintain latch. Intervention(s): Breastfeeding basics reviewed;Support Pillows;Position options;Skin to skin (mom has large/soft breasts; recommend breast support throughout feeding)  LATCH Score: 9 (LC assisted)  Lactation Tools Discussed/Used   STS, hand expression, cue feedings (minimum of q3h for late preterm baby weighing less than 6 lbs)  Consult Status Consult Status: Follow-up Date: 05/27/13 Follow-up type: In-patient    Kristin [name], Kristin [name] [facility] 05/26/2013, 9:58 PM

## 2013-05-26 NOTE — Anesthesia Preprocedure Evaluation (Signed)
Anesthesia Evaluation  Patient identified by MRN, date of birth, ID band Patient awake    Reviewed: Allergy & Precautions, H&P , NPO status , Patient's Chart, lab work & pertinent test results  History of Anesthesia Complications Negative for: history of anesthetic complications  Airway Mallampati: IV TM Distance: >3 FB Neck ROM: Full    Dental  (+) Teeth Intact   Pulmonary neg pulmonary ROS,    Pulmonary exam normal       Cardiovascular negative cardio ROS  Rhythm:Regular Rate:Normal     Neuro/Psych negative neurological ROS  negative psych ROS   GI/Hepatic negative GI ROS, Neg liver ROS,   Endo/Other  negative endocrine ROS  Renal/GU negative Renal ROS     Musculoskeletal negative musculoskeletal ROS (+)   Abdominal (+) + obese,   Peds  Hematology negative hematology ROS (+)   Anesthesia Other Findings   Reproductive/Obstetrics (+) Pregnancy                           Anesthesia Physical Anesthesia Plan  ASA: III  Anesthesia Plan: Epidural   Post-op Pain Management:    Induction:   Airway Management Planned: Natural Airway  Additional Equipment: None  Intra-op Plan:   Post-operative Plan:   Informed Consent: I have reviewed the patients History and Physical, chart, labs and discussed the procedure including the risks, benefits and alternatives for the proposed anesthesia with the patient or authorized representative who has indicated his/her understanding and acceptance.   Dental advisory given  Plan Discussed with: Anesthesiologist  Anesthesia Plan Comments:         Anesthesia Quick Evaluation

## 2013-05-26 NOTE — Progress Notes (Signed)
Kristin [name] is a 23 y.o. G2P1001 at 3228w1d by LMP admitted for induction of labor due to Intrahepatic Cholestasis of pregnancy.  Subjective:   Objective: BP 111/74  Pulse 98  Temp(Src) 98.3 F (36.8 C) (Oral)  Resp 16  Ht 5\' 5"  (1.651 m)  Wt 118.842 kg (262 lb)  BMI 43.60 kg/m2 I/O last 3 completed shifts: In: 177.1 [I.V.:177.1] Out: -     FHT:  FHR: 145 bpm, variability: moderate,  accelerations:  Present,  decelerations:  Absent UC:   irregular, every 3 minutes SVE:   Dilation: 2 Effacement (%): 80 Station: -2 Exam by:: Taylia Berber  Labs: Lab Results  Component Value Date   WBC 7.5 05/25/2013   HGB 11.9* 05/25/2013   HCT 34.6* 05/25/2013   MCV 87.6 05/25/2013   PLT 227 05/25/2013    Assessment / Plan: Induction of labor due to IHCP,  S/p misoprostol ripening Wills Start Pitocin induction  Labor: Progressing normally Preeclampsia:  N/A Fetal Wellbeing:  Category I Pain Control:  Labor support without medications I/D:  n/a Anticipated MOD:  NSVD  Kristin [name] STACIA 05/26/2013, 8:51 AM

## 2013-05-26 NOTE — Anesthesia Procedure Notes (Signed)
Epidural Patient location during procedure: OB Start time: 05/26/2013 1:47 PM End time: 05/26/2013 1:55 PM  Staffing Anesthesiologist: Chrishun Scheer, CHRIS Performed by: anesthesiologist   Preanesthetic Checklist Completed: patient identified, surgical consent, pre-op evaluation, timeout performed, IV checked, risks and benefits discussed and monitors and equipment checked  Epidural Patient position: sitting Prep: site prepped and draped and DuraPrep Patient monitoring: heart rate, cardiac monitor, continuous pulse ox and blood pressure Approach: midline Location: L3-L4 Injection technique: LOR saline  Needle:  Needle type: Tuohy  Needle gauge: 17 G Needle length: 9 cm Needle insertion depth: 8 cm Catheter type: closed end flexible Catheter size: 19 Gauge Catheter at skin depth: 13 cm Test dose: Other  Assessment Events: blood not aspirated, injection not painful, no injection resistance, negative IV test and no paresthesia  Additional Notes H+P and labs checked, risks and benefits discussed with the patient, consent obtained, procedure tolerated well and without complications.  Reason for block:procedure for pain

## 2013-05-27 LAB — CBC
HEMATOCRIT: 33.4 % — AB (ref 36.0–46.0)
Hemoglobin: 11.2 g/dL — ABNORMAL LOW (ref 12.0–15.0)
MCH: 29.7 pg (ref 26.0–34.0)
MCHC: 33.5 g/dL (ref 30.0–36.0)
MCV: 88.6 fL (ref 78.0–100.0)
Platelets: 197 10*3/uL (ref 150–400)
RBC: 3.77 MIL/uL — ABNORMAL LOW (ref 3.87–5.11)
RDW: 14.8 % (ref 11.5–15.5)
WBC: 8.5 10*3/uL (ref 4.0–10.5)

## 2013-05-27 LAB — COMPREHENSIVE METABOLIC PANEL
ALBUMIN: 2 g/dL — AB (ref 3.5–5.2)
ALT: 49 U/L — ABNORMAL HIGH (ref 0–35)
AST: 35 U/L (ref 0–37)
Alkaline Phosphatase: 115 U/L (ref 39–117)
BILIRUBIN TOTAL: 0.3 mg/dL (ref 0.3–1.2)
BUN: 4 mg/dL — AB (ref 6–23)
CO2: 23 mEq/L (ref 19–32)
CREATININE: 0.56 mg/dL (ref 0.50–1.10)
Calcium: 8.7 mg/dL (ref 8.4–10.5)
Chloride: 102 mEq/L (ref 96–112)
GFR calc Af Amer: >90 mL/min (ref >90)
GFR calc non Af Amer: >90 mL/min (ref >90)
GLUCOSE: 119 mg/dL — AB (ref 70–99)
Potassium: 3.5 mEq/L — ABNORMAL LOW (ref 3.7–5.3)
Sodium: 135 mEq/L — ABNORMAL LOW (ref 137–147)
TOTAL PROTEIN: 5.9 g/dL — AB (ref 6.0–8.3)

## 2013-05-27 NOTE — Discharge Summary (Addendum)
Obstetric Discharge Summary Reason for Admission: induction of labor for cholestasis Prenatal Procedures: ultrasound Intrapartum Procedures: spontaneous vaginal delivery Postpartum Procedures: none Complications-Operative and Postpartum: none Hemoglobin  Date Value Ref Range Status  05/27/2013 11.2* 12.0 - 15.0 g/dL Final     HCT  Date Value Ref Range Status  05/27/2013 33.4* 36.0 - 46.0 % Final   Repeat liver enzymes, declining, approaching normal. Physical Exam:  General: alert and cooperative Lochia: appropriate Uterine Fundus: firm DVT Evaluation: No evidence of DVT seen on physical exam.  Discharge Diagnoses: Term Pregnancy-delivered  Discharge Information: Date: 05/27/2013 Activity: pelvic rest Diet: routine Medications: PNV and Ibuprofen Condition: stable Instructions: refer to practice specific booklet Discharge to: home Follow-up Information   Follow up with PINN, Sanjuana MaeWALDA STACIA, MD In 4 weeks.   Specialty:  Obstetrics and Gynecology   Contact information:   [address] [place] [address] [phone]       Newborn Data: Live born female  Birth Weight: 5 lb 4 oz (2381 g) APGAR: 8, 9  Home with mother.  Philip AspenCALLAHAN, Idris Edmundson 05/27/2013, 2:34 PM

## 2013-05-27 NOTE — Progress Notes (Signed)
Pt teaching  Complete baby  Home with mom

## 2013-05-27 NOTE — Progress Notes (Signed)
UR chart review completed.  

## 2013-05-27 NOTE — Anesthesia Postprocedure Evaluation (Signed)
Anesthesia Post Note  Patient: Kristin [name]  Procedure(s) Performed: * No procedures listed *  Anesthesia type: Epidural  Patient location: Mother/Baby  Post pain: Pain level controlled  Post assessment: Post-op Vital signs reviewed  Last Vitals:  Filed Vitals:   05/27/13 0700  BP: 126/79  Pulse: 84  Temp: 36.8 C  Resp: 18    Post vital signs: Reviewed  Level of consciousness:alert  Complications: No apparent anesthesia complications

## 2013-06-30 ENCOUNTER — Ambulatory Visit (HOSPITAL_COMMUNITY)
Admission: RE | Admit: 2013-06-30 | Discharge: 2013-06-30 | Disposition: A | Discharge status: Home / Self Care | Payer: BC Managed Care – PPO | Source: Ambulatory Visit | Attending: Obstetrics and Gynecology | Admitting: Obstetrics and Gynecology

## 2013-06-30 NOTE — Lactation Note (Signed)
Adult Lactation Consultation Outpatient Visit Note  Patient Name: Kristin [name] Date of Birth: [birth year] Gestational Age at Delivery: Unknown Type of Delivery:   Baby - Kem Boroughs DOB - [birth year] (36 weeks old at time of visit) Vaginal BW- 5 lbs, 4 oz GA - 37.1 Discharged on 2/24 - 2% weight loss at discharge  Infant was discharged from hospital and was put on Neosure supplementation by Peds MD with a maximum amount of 6 bottles per day 90 ml each feeding at most recent feeding 2 days ago; mom reports breastfeeding some in addition to the bottles during the past 5 weeks.  Infant was admitted to hospital over the weekend on Friday because "she stopped breathing" and was discharged on Saturday; f/u visit with peds yesterday.  Mom stated they gave her fluids in the hospital.  At yesterday's ped's visit MD d/c'd Neosure and mom has been exclusively direct breastfeeding since yesterday - no bottles of supplementation in past 24 hours.  At MD visit yesterday pt weighed 7 lbs, 4 oz.  At today's visit infant weighed 7, 4.9 oz.  Weight has remained steady.  Throughout consultation no abnormalities noted with infant's breathing.  Infant handled let-down well, no gagging, no gasping, no choking episodes.  Mom is concerned about tongue-tie noted by Peds MD and has "been waiting for a referral for tongue-tie" from Peds, but states that peds is hesitant about infant receiving any kind of anesthesia for tongue clipping d/t LPTI status.  Mom is very concerned about maintaining a milk supply with a tongue-tie and wishes to exclusively breastfeed; mom stated that the waiting for a referral and the uncertainty about maintaining a milk supply "has me depressed" because she so desires to have a full milk supply.  Mom c/o slight tenderness a few weeks ago but denies any significant pain within past 24 hrs.  Upon assessment noted large soft breast, large nipples; no nipple abnormalities noted; no nipple damage noted.  Mom  states infant is gassy and is on medication for reflux; stated infant has pain with reflux.  LC educated mom that reflux can be a symptom of tongue-tie.  Upon assessment of infant, upper and lower blanching of frenulum noted.  Infant is able to extend tongue to gum line but does not maintain a consistent seal around gloved finger with tongue when sucking.  Mom breastfed previous infant for 14 months and wants to do the same with this infant.  Mom reports that at three weeks milk supply decreased with pumpings from 10 oz per pumping (first 2 weeks) to 2 oz per pumping (at three weeks to present).    Breastfeeding History: Frequency of Breastfeeding: for past 24 hrs exclusively direct breastfed 7-8 times Length of Feeding: 10 min and then goes to sleep Voids: 6-7 Stools: 3 - green mucus with few seeds noted  Supplementing / Method: Pumping:  Type of Pump: DEBP   Frequency: increased pumpings from 3 (as she has been doing for the past 2 weeks) to 4-6 for the past two days  Volume:  2 oz (60 ml) pumping directly (within 1-2 min) after feedings  Consultation Evaluation:  Initial Feeding Assessment: Pre-feed Weight: 3314 g. (7 lbs, 4 oz) Post-feed Weight: 3376 g. Amount Transferred: 62 ml Comments:  Mom independently latched infant on right side with cradle hold and with depth.  LC encouraged rotating infant's body to face mom's body (belly-to-belly) and taught assuring flanging of bottom lip with latching; when LC flanged lip infant latched deeper.  Gulping noted at beginning of feeding and audible swallows noted throughout feeding.  Infant remained in a consistent pattern for a continuous 20 minutes before slowing with a light-flutter suck.  Mom removed infant from breast for weighing and infant was satisfied; milk noted in corners of mouth.  Nipple was round and elongated; no pinching noted and mom denied any pain during the feeding.  At re-weighing and LC checking infant's mouth (noted earlier)  infant awoke and began showing additional feeding cues.  Mom re-latched infant to second side for additional feeding.  Additional Feeding Assessment: Pre-feed Weight: 3376 g Post-feed Weight: 3396 g. Amount Transferred: 20 ml Comments:  Infant fed on left side for additional feeding.  Mom c/o slight tenderness with cradle hold on left side.  [facility] mom how to use cross-cradle hold with asymetrical latching to assure depth.  Mom independently latched with cross-cradle hold and then switched arms to cradle hold.  Lots audible swallows heard.  Infant fed for an additional 5 minutes and then came off.    Total Breast milk Transferred this Visit: 82 ml  Total Supplement Given: none  Additional Interventions: Encouragement given that the infant ate an appropriate amount given adjusted age and weight of infant; mom stated satisfaction with amount of milk infant transferred in the 25 minute feeding.  Encouraged mom to keep a feeding log to assure the infant is eating 8-12 times per day also logging voids and stools.  Mom inquired about fenugreek; handouts given on fenugreek, lactation cookies, and moringa.  Also encouraged mom to wait about 30 minutes after feeding before she pumps and to use hands-on pumping technique to maximize milk output.  Mom expressed desire to get tongue-tie corrected and is concerned about maintaining a milk supply.  Reassurance given and explained milk supply / demand in maintaining milk supply but also gave information about tongue-tie corrections for preserving a breastfeeding relationship.  Mom is part of a Facebook tongue-tie group she has used for support and stated she wants to use laser correction but did not know of a MD in [place] who used laser.  Dr. Orland Mustard[place]Murtry in [place] uses laser for tongue-tie corrections; mom asked for his name; name given and encouraged mom to look at Dr. Tretha SciaraMcMurtry's webpage regarding tongue-ties.  Additional hand-out on "breastfeeing: Latch-on 1,2,3..."  given for referral to asymetrical latching technique used during consult.  Encouraged mom to have a follow-up appointment for a weight check since she plans to exclusively breastfeed with some additional pumping.  Next MD Peds visit in 3 weeks; Smart Start has been D/C'd; so encouraged mom to follow-up with lactation next week.  Appointment made for 10 days from today (Thurs. April 9 @ 2:30p).      Follow-Up Peds - in 3 weeks LC - in 10 days - Thursday, July 10, 2013 @ 2:30p     Lendon KaVann, Tenika Keeran Walker 06/30/2013, 3:46 PM

## 2013-07-10 ENCOUNTER — Ambulatory Visit (HOSPITAL_COMMUNITY): Payer: BC Managed Care – PPO

## 2013-09-15 ENCOUNTER — Emergency Department (HOSPITAL_COMMUNITY): Payer: BC Managed Care – PPO

## 2013-09-15 ENCOUNTER — Emergency Department (HOSPITAL_COMMUNITY)
Admission: EM | Admit: 2013-09-15 | Discharge: 2013-09-15 | Disposition: A | Discharge status: Home / Self Care | Payer: BC Managed Care – PPO | Attending: Emergency Medicine | Admitting: Emergency Medicine

## 2013-09-15 DIAGNOSIS — R51 Headache: Secondary | ICD-10-CM | POA: Diagnosis present

## 2013-09-15 DIAGNOSIS — G43109 Migraine with aura, not intractable, without status migrainosus: Secondary | ICD-10-CM | POA: Insufficient documentation

## 2013-09-15 DIAGNOSIS — R479 Unspecified speech disturbances: Secondary | ICD-10-CM

## 2013-09-15 DIAGNOSIS — R209 Unspecified disturbances of skin sensation: Secondary | ICD-10-CM | POA: Diagnosis not present

## 2013-09-15 DIAGNOSIS — Z8719 Personal history of other diseases of the digestive system: Secondary | ICD-10-CM | POA: Diagnosis not present

## 2013-09-15 DIAGNOSIS — R4789 Other speech disturbances: Secondary | ICD-10-CM | POA: Diagnosis not present

## 2013-09-15 LAB — CBC
HCT: 37.4 % (ref 36.0–46.0)
HEMOGLOBIN: 12.4 g/dL (ref 12.0–15.0)
MCH: 29.5 pg (ref 26.0–34.0)
MCHC: 33.2 g/dL (ref 30.0–36.0)
MCV: 89 fL (ref 78.0–100.0)
Platelets: 327 10*3/uL (ref 150–400)
RBC: 4.2 MIL/uL (ref 3.87–5.11)
RDW: 14.7 % (ref 11.5–15.5)
WBC: 7.3 10*3/uL (ref 4.0–10.5)

## 2013-09-15 LAB — RAPID URINE DRUG SCREEN, HOSP PERFORMED
Amphetamines: NOT DETECTED
Barbiturates: NOT DETECTED
Benzodiazepines: NOT DETECTED
COCAINE: NOT DETECTED
OPIATES: NOT DETECTED
TETRAHYDROCANNABINOL: NOT DETECTED

## 2013-09-15 LAB — COMPREHENSIVE METABOLIC PANEL
ALBUMIN: 3.4 g/dL — AB (ref 3.5–5.2)
ALK PHOS: 75 U/L (ref 39–117)
ALT: 22 U/L (ref 0–35)
AST: 20 U/L (ref 0–37)
BILIRUBIN TOTAL: 0.2 mg/dL — AB (ref 0.3–1.2)
BUN: 13 mg/dL (ref 6–23)
CHLORIDE: 101 meq/L (ref 96–112)
CO2: 24 mEq/L (ref 19–32)
CREATININE: 0.72 mg/dL (ref 0.50–1.10)
Calcium: 9.5 mg/dL (ref 8.4–10.5)
GFR calc non Af Amer: >90 mL/min (ref >90)
Glucose, Bld: 95 mg/dL (ref 70–99)
POTASSIUM: 4.1 meq/L (ref 3.7–5.3)
Sodium: 137 mEq/L (ref 137–147)
Total Protein: 7.9 g/dL (ref 6.0–8.3)

## 2013-09-15 LAB — ETHANOL

## 2013-09-15 LAB — DIFFERENTIAL
BASOS PCT: 0 % (ref 0–1)
Basophils Absolute: 0 10*3/uL (ref 0.0–0.1)
Eosinophils Absolute: 0.1 10*3/uL (ref 0.0–0.7)
Eosinophils Relative: 1 % (ref 0–5)
LYMPHS ABS: 2.2 10*3/uL (ref 0.7–4.0)
Lymphocytes Relative: 30 % (ref 12–46)
MONO ABS: 0.5 10*3/uL (ref 0.1–1.0)
MONOS PCT: 6 % (ref 3–12)
NEUTROS ABS: 4.6 10*3/uL (ref 1.7–7.7)
Neutrophils Relative %: 63 % (ref 43–77)

## 2013-09-15 LAB — URINALYSIS, ROUTINE W REFLEX MICROSCOPIC
Bilirubin Urine: NEGATIVE
Glucose, UA: NEGATIVE mg/dL
HGB URINE DIPSTICK: NEGATIVE
Ketones, ur: NEGATIVE mg/dL
Leukocytes, UA: NEGATIVE
Nitrite: NEGATIVE
Protein, ur: NEGATIVE mg/dL
SPECIFIC GRAVITY, URINE: 1.019 (ref 1.005–1.030)
Urobilinogen, UA: 0.2 mg/dL (ref 0.0–1.0)
pH: 6.5 (ref 5.0–8.0)

## 2013-09-15 LAB — I-STAT CHEM 8, ED
BUN: 13 mg/dL (ref 6–23)
CALCIUM ION: 1.26 mmol/L — AB (ref 1.12–1.23)
CHLORIDE: 101 meq/L (ref 96–112)
Creatinine, Ser: 0.9 mg/dL (ref 0.50–1.10)
Glucose, Bld: 96 mg/dL (ref 70–99)
HEMATOCRIT: 42 % (ref 36.0–46.0)
Hemoglobin: 14.3 g/dL (ref 12.0–15.0)
Potassium: 4.1 mEq/L (ref 3.7–5.3)
SODIUM: 141 meq/L (ref 137–147)
TCO2: 25 mmol/L (ref 0–100)

## 2013-09-15 LAB — PROTIME-INR
INR: 0.99 (ref 0.00–1.49)
Prothrombin Time: 12.9 seconds (ref 11.6–15.2)

## 2013-09-15 LAB — I-STAT TROPONIN, ED: TROPONIN I, POC: 0 ng/mL (ref 0.00–0.08)

## 2013-09-15 LAB — APTT: aPTT: 33 seconds (ref 24–37)

## 2013-09-15 MED ORDER — MAGNESIUM SULFATE 40 MG/ML IJ SOLN
2.0000 g | Freq: Once | INTRAMUSCULAR | Status: AC
  Administered 2013-09-15: 2 g via INTRAVENOUS
  Filled 2013-09-15: qty 50

## 2013-09-15 MED ORDER — SODIUM CHLORIDE 0.9 % IV BOLUS (SEPSIS)
1000.0000 mL | Freq: Once | INTRAVENOUS | Status: AC
  Administered 2013-09-15: 1000 mL via INTRAVENOUS

## 2013-09-15 MED ORDER — DIPHENHYDRAMINE HCL 50 MG/ML IJ SOLN
25.0000 mg | Freq: Once | INTRAMUSCULAR | Status: AC
  Administered 2013-09-15: 25 mg via INTRAVENOUS
  Filled 2013-09-15: qty 1

## 2013-09-15 MED ORDER — PROCHLORPERAZINE MALEATE 10 MG PO TABS
10.0000 mg | ORAL_TABLET | Freq: Once | ORAL | Status: DC
Start: 2013-09-15 — End: 2013-09-15
  Filled 2013-09-15: qty 1

## 2013-09-15 MED ORDER — PROCHLORPERAZINE EDISYLATE 5 MG/ML IJ SOLN
5.0000 mg | Freq: Once | INTRAMUSCULAR | Status: AC
  Administered 2013-09-15: 5 mg via INTRAVENOUS
  Filled 2013-09-15: qty 2

## 2013-09-15 MED ORDER — KETOROLAC TROMETHAMINE 30 MG/ML IJ SOLN
30.0000 mg | Freq: Once | INTRAMUSCULAR | Status: AC
  Administered 2013-09-15: 30 mg via INTRAVENOUS
  Filled 2013-09-15: qty 1

## 2013-09-15 NOTE — ED Notes (Signed)
Patient declined wheelchair. RN escorted patient out.

## 2013-09-15 NOTE — Consult Note (Signed)
Referring Physician: Effie Shy    Chief Complaint: HA and difficulty talking -Code stroke  HPI:                                                                                                                                         Kristin [name] is an 23 y.o. female who was at work when she had a sudden onset HA in the back of her head, followed by period of nausea.  This occurred at 1405. EMS was called and on arrival patient was noted to have difficulty recalling her birth date, sons name, boyfriends name. She was able to give information but seemed to have difficulty expressing herself.  She admitted to having right arm decreased sensation. On arrival to ED she was minimally verbal only giving yes and no answers.  She was moving all extremities but unabe to repeat words, name objects. Initial CT head was negative for blood. Patient was brought to MRI.   Date last known well: Date: 09/15/2013 Time last known well: Time: 14:05 tPA Given: No: not a stroke.   Past Medical History  Diagnosis Date  . No pertinent past medical history   . Cholecystitis   . Cholestasis of pregnancy in third trimester     Past Surgical History  Procedure Laterality Date  . No past surgeries      Family History  Problem Relation Age of Onset  . Other Neg Hx   . Alcohol abuse Neg Hx   . Asthma Neg Hx   . Arthritis Neg Hx   . Birth defects Neg Hx   . Cancer Neg Hx   . COPD Neg Hx   . Depression Neg Hx   . Diabetes Neg Hx   . Drug abuse Neg Hx   . Early death Neg Hx   . Hearing loss Neg Hx   . Heart disease Neg Hx   . Hyperlipidemia Neg Hx   . Hypertension Neg Hx   . Kidney disease Neg Hx   . Learning disabilities Neg Hx   . Mental illness Neg Hx   . Mental retardation Neg Hx   . Miscarriages / Stillbirths Neg Hx   . Stroke Neg Hx   . Vision loss Neg Hx   . Varicose Veins Neg Hx    Social History:  reports that she has never smoked. She has never used smokeless tobacco. She reports that she does not  drink alcohol or use illicit drugs.  Allergies: No Known Allergies  Medications:  No current facility-administered medications for this encounter.   Current Outpatient Prescriptions  Medication Sig Dispense Refill  . acetaminophen (TYLENOL) 500 MG tablet Take 1,000 mg by mouth every 8 (eight) hours as needed for moderate pain.      . calcium carbonate (TUMS - DOSED IN MG ELEMENTAL CALCIUM) 500 MG chewable tablet Chew 1-2 tablets by mouth every 4 (four) hours as needed for indigestion or heartburn.      . Prenatal Vit-Fe Fumarate-FA (PRENATAL MULTIVITAMIN) TABS tablet Take 1 tablet by mouth daily at 12 noon.        ROS:                                                                                                                                       History obtained from unobtainable from patient due to decreased language output  General ROS: negative for - chills, fatigue, fever, night sweats, weight gain or weight loss Psychological ROS: negative for - behavioral disorder, hallucinations, memory difficulties, mood swings or suicidal ideation Ophthalmic ROS: negative for - blurry vision, double vision, eye pain or loss of vision ENT ROS: negative for - epistaxis, nasal discharge, oral lesions, sore throat, tinnitus or vertigo Allergy and Immunology ROS: negative for - hives or itchy/watery eyes Hematological and Lymphatic ROS: negative for - bleeding problems, bruising or swollen lymph nodes Endocrine ROS: negative for - galactorrhea, hair pattern changes, polydipsia/polyuria or temperature intolerance Respiratory ROS: negative for - cough, hemoptysis, shortness of breath or wheezing Cardiovascular ROS: negative for - chest pain, dyspnea on exertion, edema or irregular heartbeat Gastrointestinal ROS: negative for - abdominal pain, diarrhea, hematemesis, nausea/vomiting  or stool incontinence Genito-Urinary ROS: negative for - dysuria, hematuria, incontinence or urinary frequency/urgency Musculoskeletal ROS: negative for - joint swelling or muscular weakness Neurological ROS: as noted in HPI Dermatological ROS: negative for rash and skin lesion changes  Neurologic Examination:                                                                                                      SpO2 98.00%, unknown if currently breastfeeding. Filed Vitals:   09/15/13 1800  BP: 134/78  Pulse: 80  Temp:   Resp: 19   General: NAD Mental Status: Patient is awake, alert, oriented to person, place, month, year, and situation. She has stuttering speech "I.." "Ican't ..." "I can't get the words..." "I can't get the words I want out." She is able to write. She has a varying ability to talk.  She has no signs of neglect.  Cranial Nerves: II: Visual Fields are full. Pupils are equal, round, and reactive to light.  Discs are difficult to visualize. III,IV, VI: EOMI without ptosis or diploplia.  V: Facial sensation is decreased on the left, she splits midline including to vibration.  VII: Facial movement is symmetric.  VIII: hearing is intact to voice X: Uvula elevates symmetrically XI: Shoulder shrug is symmetric. XII: tongue is midline without atrophy or fasciculations.  Motor: Tone is normal. Bulk is normal. Good strength was present in all four extremities though with varying degrees of effort. With repeated trials she appears to have at least 4/5 strength in all extremities.  Sensory: Sensation is decreased on the left.  Deep Tendon Reflexes: 2+ and symmetric in the biceps and patellae.  Cerebellar: No clear ataxia Gait: Not assessed due to acute nature of evaluation and multiple medical monitors in ED setting.  Lab Results: Basic Metabolic Panel:  Recent Labs Lab 09/15/13 1508  NA 141  K 4.1  CL 101  GLUCOSE 96  BUN 13  CREATININE 0.90    Liver Function  Tests: No results found for this basename: AST, ALT, ALKPHOS, BILITOT, PROT, ALBUMIN,  in the last 168 hours No results found for this basename: LIPASE, AMYLASE,  in the last 168 hours No results found for this basename: AMMONIA,  in the last 168 hours  CBC:  Recent Labs Lab 09/15/13 1504 09/15/13 1508  WBC 7.3  --   NEUTROABS 4.6  --   HGB 12.4 14.3  HCT 37.4 42.0  MCV 89.0  --   PLT 327  --     Cardiac Enzymes: No results found for this basename: CKTOTAL, CKMB, CKMBINDEX, TROPONINI,  in the last 168 hours  Lipid Panel: No results found for this basename: CHOL, TRIG, HDL, CHOLHDL, VLDL, LDLCALC,  in the last 168 hours  CBG: No results found for this basename: GLUCAP,  in the last 168 hours  Microbiology: Results for orders placed in visit on 05/21/13  OB RESULTS CONSOLE GBS     Status: None   Collection Time    05/16/13 12:00 AM      Result Value Ref Range Status   GBS Positive   Final    Coagulation Studies: No results found for this basename: LABPROT, INR,  in the last 72 hours  Imaging: Ct Head Wo Contrast  09/15/2013   CLINICAL DATA:  Code stroke, last seen normal at 14 15 today  EXAM: CT HEAD WITHOUT CONTRAST  TECHNIQUE: Contiguous axial images were obtained from the base of the skull through the vertex without intravenous contrast.  COMPARISON:  12/21/2012  FINDINGS: Normal ventricular morphology.  No midline shift or mass effect.  Beam hardening artifacts at the middle cranial fossa more prominent on RIGHT, unchanged from previous exam.  No intracranial hemorrhage, mass lesion, or evidence acute infarction.  No extra-axial fluid collections.  Bones and sinuses unremarkable.  IMPRESSION: No acute intracranial abnormalities.  Critical Value/emergent results were called by telephone at the time of interpretation on 09/15/2013 at 3:20 PM to Dr. Joanne GavelKrkpatrick, who verbally acknowledged these results.   Electronically Signed   By: Ulyses SouthwardMark  Boles M.D.   On: 09/15/2013 15:21        Felicie MornDavid Smith PA-C [facility] [phone]  09/15/2013, 3:23 PM   Assessment: 23 y.o. female with a history of several hours of headache, nausea, speech difficulty. I suspect complicated migraine, but she does have some factors which could be  suggestive of a psychogenic etiology. MRI/MRV is negative.   Stroke Risk Factors - none  1) Treat for complicated migraine, if symptoms resolve no further workup.  2) If symptoms persists, could consider CTA head and neck to rule out dissection or reversible cerebral vasospasm.   Kristin SlotMcNeill Sharilynn Cassity, MD [facility] [phone]  If 7pm- 7am, please page neurology on call as listed in AMION.

## 2013-09-15 NOTE — Code Documentation (Signed)
22[facility]  EMS reports that the patient was at work when she had sudden onset unresponsiveness with eye rolling.  She then c/o HA and nausea.  On EMS exam she was unable to express herself and c/o her left side feeling heavy.  Of note, she is post partum and just returned to work yesterday.  She is taking Reglan to help with milk production and should be taking it 4 times a day per patient, but told EMS that she took all 4 doses this morning.  She reports having headaches since she started the Reglan.  Patient taken to CT on arrival.  Patient then taken to MRI which was negative for acute infarct per Dr. Amada JupiterKirkpatrick.  Code stroke canceled at 1534.  Bedside handoff with ED RN.

## 2013-09-15 NOTE — ED Provider Notes (Signed)
  Face-to-face evaluation   History: She was at work today, when she had sudden onset of difficulty speaking and her coworker noticed her eyes rolled back. She presented to the ED today as a code stroke. She had rapid assessment by neurology and they canceled the code stroke.  Physical exam: Alert, anxious, cooperative. No dysarthria. She speaks in slow, hesitant, sentences. She has trouble identifying some words and speaking them. Strength and sensation is normal bilaterally. There is no dysarthria.  Medical screening examination/treatment/procedure(s) were conducted as a shared visit with non-physician practitioner(s) and myself.  I personally evaluated the patient during the encounter  Flint MelterElliott L Yuliya Nova, MD 09/16/13 1336

## 2013-09-15 NOTE — ED Notes (Signed)
EMS - [facility] coming [facility] of 1400 today.  Pt is verbal to certain words and c/o of left sided numbness and occipital headache.

## 2013-09-15 NOTE — ED Notes (Signed)
Pt came in via EMS, per EMS, coworkers stated pt's eyes "rolled into the back of her head" and pt was not acting appropriately.  Pt c/o h/a at the base of the left side of her head and is having some difficulty speaking and is repeating initial phrases.  Pt follows all commands without difficulty, has equal strength, is able to assist in her activities.  Pt was recently started on Mirena and completed a Reglan treatment today to assist in breastfeeding, but pt admits to taking all four doses this morning of the Reglan.

## 2013-09-15 NOTE — ED Notes (Signed)
NIH not done per rapid response nurse due to code stroke being cancelled due to negative MRI.

## 2013-09-15 NOTE — ED Provider Notes (Signed)
CSN: [id number]     Arrival date & time 09/15/13  1459 History   First MD Initiated Contact with Patient 09/15/13 1513     Chief Complaint  Patient presents with  . Code Stroke     (Consider location/radiation/quality/duration/timing/severity/associated sxs/prior Treatment) HPI   Pt encoded as a level 1 code stroke. ER Physician and Neurology team met patient at the bridge upon arrival by EMS.  Pt went to CT head and then MR brain before my interview.  She had sudden onset headache in the back of her head, followed by a period of nausea. It started at 1205. Per neurology she was having difficulty expressing herself and decreased sensation in her right arm.   She reports that she is currently breast feeding and recently went back to work, unable to tell us when. When asked if something significant happened in the past week, she said something did but is unable to tell me what.     Past Medical History  Diagnosis Date  . No pertinent past medical history   . Cholecystitis   . Cholestasis of pregnancy in third trimester    Past Surgical History  Procedure Laterality Date  . No past surgeries     Family History  Problem Relation Age of Onset  . Other Neg Hx   . Alcohol abuse Neg Hx   . Asthma Neg Hx   . Arthritis Neg Hx   . Birth defects Neg Hx   . Cancer Neg Hx   . COPD Neg Hx   . Depression Neg Hx   . Diabetes Neg Hx   . Drug abuse Neg Hx   . Early death Neg Hx   . Hearing loss Neg Hx   . Heart disease Neg Hx   . Hyperlipidemia Neg Hx   . Hypertension Neg Hx   . Kidney disease Neg Hx   . Learning disabilities Neg Hx   . Mental illness Neg Hx   . Mental retardation Neg Hx   . Miscarriages / Stillbirths Neg Hx   . Stroke Neg Hx   . Vision loss Neg Hx   . Varicose Veins Neg Hx    History  Substance Use Topics  . Smoking status: Never Smoker   . Smokeless tobacco: Never Used  . Alcohol Use: No     Comment: occasional ETOH use prior to pregnancy   OB History    Grav Para Term Preterm Abortions TAB SAB Ect Mult Living   '2 2 2 '$ 0 0 0 0 0 0 2     Review of Systems  Neurological: Positive for speech difficulty, numbness and headaches.  All other systems reviewed and are negative.     Allergies  Review of patient's allergies indicates no known allergies.  Home Medications   Prior to Admission medications   Medication Sig Start Date End Date Taking? Authorizing Provider  metoCLOPramide (REGLAN) 10 MG tablet Take 10 mg by mouth every 8 (eight) hours as needed for nausea.   Yes Historical Provider, MD   BP 107/77  Pulse 83  Temp(Src) 98 F (36.7 C)  Resp 16  Ht $R'5\' 5"'Fr$  (1.651 m)  Wt 257 lb (116.574 kg)  BMI 42.77 kg/m2  SpO2 97% Physical Exam  Nursing note and vitals reviewed. Constitutional: She appears well-developed and well-nourished. No distress.  HENT:  Head: Normocephalic and atraumatic.  Eyes: Pupils are equal, round, and reactive to light.  Neck: Normal range of motion. Neck supple.  Cardiovascular: Normal rate  and regular rhythm.   Pulmonary/Chest: Effort normal.  Abdominal: Soft.  Neurological: She is alert.  Decreased sensation to left side of face. Decreased strength to left leg. Otherwise patient strength and sensation is normal.   Pt is able to talk but is having expressive aphagia.  Skin: Skin is warm and dry.      ED Course  Procedures (including critical care time) Labs Review Labs Reviewed  COMPREHENSIVE METABOLIC PANEL - Abnormal; Notable for the following:    Albumin 3.4 (*)    Total Bilirubin 0.2 (*)    All other components within normal limits  I-STAT CHEM 8, ED - Abnormal; Notable for the following:    Calcium, Ion 1.26 (*)    All other components within normal limits  ETHANOL  PROTIME-INR  APTT  CBC  DIFFERENTIAL  URINE RAPID DRUG SCREEN (HOSP PERFORMED)  URINALYSIS, ROUTINE W REFLEX MICROSCOPIC  I-STAT TROPOININ, ED  I-STAT TROPOININ, ED    Imaging Review Ct Head Wo Contrast  09/15/2013    CLINICAL DATA:  Code stroke, last seen normal at 69 15 today  EXAM: CT HEAD WITHOUT CONTRAST  TECHNIQUE: Contiguous axial images were obtained from the base of the skull through the vertex without intravenous contrast.  COMPARISON:  12/21/2012  FINDINGS: Normal ventricular morphology.  No midline shift or mass effect.  Beam hardening artifacts at the middle cranial fossa more prominent on RIGHT, unchanged from previous exam.  No intracranial hemorrhage, mass lesion, or evidence acute infarction.  No extra-axial fluid collections.  Bones and sinuses unremarkable.  IMPRESSION: No acute intracranial abnormalities.  Critical Value/emergent results were called by telephone at the time of interpretation on 09/15/2013 at 3:20 PM to Dr. Gaspar Cola, who verbally acknowledged these results.   Electronically Signed   By: Lavonia Dana M.D.   On: 09/15/2013 15:21   Mr Brain Wo Contrast  09/15/2013   CLINICAL DATA:  23 year old recently postpartum female with acute onset expressive aphasia. Continued abnormal speech. Initial encounter. Code stroke.  EXAM: MRI HEAD WITHOUT CONTRAST  MRV HEAD WITHOUT CONTRAST  TECHNIQUE: Multiplanar, multiecho pulse sequences of the brain and surrounding structures were obtained without intravenous contrast. Angiographic images of the head were obtained using MRV technique without contrast.  COMPARISON:  Head CT without contrast 1510 hr the same day.  FINDINGS: MRI HEAD FINDINGS  No restricted diffusion or evidence of acute infarction. Major intracranial vascular flow voids are preserved.  Cerebral volume is normal. No midline shift, mass effect, evidence of mass lesion, ventriculomegaly, extra-axial collection or acute intracranial hemorrhage. Cervicomedullary junction and pituitary are within normal limits. Negative visualized cervical spine. Pearline Cables and white matter signal is within normal limits throughout the brain.  Visible internal auditory structures appear normal. Visualized orbit soft  tissues are within normal limits. Visualized paranasal sinuses and mastoids are clear. Visualized scalp soft tissues are within normal limits. Visualized bone marrow signal is within normal limits.  MRV HEAD FINDINGS  Preserved flow signal in the superior sagittal sinus, torcula, straight sinus, vein of Galen, internal cerebral veins and basal veins of Rosenthal. Preserved flow signal in both transverse and sigmoid sinuses. Preserved flow signal in both internal jugular bulbs. Preserved flow signal in both sphenoparietal sinuses, and in major bilateral cortical veins.  IMPRESSION: 1.  Normal MRI appearance of the brain. 2. Negative intracranial MRV.   Electronically Signed   By: Lars Pinks M.D.   On: 09/15/2013 16:36   Mr Mrv Head Wo Cm  09/15/2013   CLINICAL DATA:  23 year old recently postpartum female with acute onset expressive aphasia. Continued abnormal speech. Initial encounter. Code stroke.  EXAM: MRI HEAD WITHOUT CONTRAST  MRV HEAD WITHOUT CONTRAST  TECHNIQUE: Multiplanar, multiecho pulse sequences of the brain and surrounding structures were obtained without intravenous contrast. Angiographic images of the head were obtained using MRV technique without contrast.  COMPARISON:  Head CT without contrast 1510 hr the same day.  FINDINGS: MRI HEAD FINDINGS  No restricted diffusion or evidence of acute infarction. Major intracranial vascular flow voids are preserved.  Cerebral volume is normal. No midline shift, mass effect, evidence of mass lesion, ventriculomegaly, extra-axial collection or acute intracranial hemorrhage. Cervicomedullary junction and pituitary are within normal limits. Negative visualized cervical spine. Pearline Cables and white matter signal is within normal limits throughout the brain.  Visible internal auditory structures appear normal. Visualized orbit soft tissues are within normal limits. Visualized paranasal sinuses and mastoids are clear. Visualized scalp soft tissues are within normal limits.  Visualized bone marrow signal is within normal limits.  MRV HEAD FINDINGS  Preserved flow signal in the superior sagittal sinus, torcula, straight sinus, vein of Galen, internal cerebral veins and basal veins of Rosenthal. Preserved flow signal in both transverse and sigmoid sinuses. Preserved flow signal in both internal jugular bulbs. Preserved flow signal in both sphenoparietal sinuses, and in major bilateral cortical veins.  IMPRESSION: 1.  Normal MRI appearance of the brain. 2. Negative intracranial MRV.   Electronically Signed   By: Lars Pinks M.D.   On: 09/15/2013 16:36     EKG Interpretation None      MDM   Final diagnoses:  Speech abnormality   3:53 pm Neurology, Dr. Katherine Roan has informed me that the patient is not a code stroke and most likely a complicated migraine. No tpa given. After further evaluation neurology questions psychogenic etiology or complex migraine. He recommends fluids and 2 mg Magnesium.   6:11 pm On further evaluation, pt has not had any improvement with the IV magnesium. I spoke with Dr. Katherine Roan who recommends Benadryl, Toradol and compazine. IF these medications do not help then a CT angiogram head/neck can be ordered to r/o reversible cerebral vasoconstriction syndrome or arterial dissection.  8: 04 pm Patients symptoms have completely resolved with the cocktail of medications. She verbalized some concerns about not being able to produce enough milk to feed her baby and that formula is too expensive. I have advised her to no longer take Reglan and that she needs to follow-up with her Gynecologist. Swallow screen passed, pt ambulated without assistance in room.  CRITICAL CARE Performed by: Linus Mako Total critical care time: 60 Critical care time was exclusive of separately billable procedures and treating other patients. Critical care was necessary to treat or prevent imminent or life-threatening deterioration. Critical care was time spent  personally by me on the following activities: development of treatment plan with patient and/or surrogate as well as nursing, discussions with consultants, evaluation of patient's response to treatment, examination of patient, obtaining history from patient or surrogate, ordering and performing treatments and interventions, ordering and review of laboratory studies, ordering and review of radiographic studies, pulse oximetry and re-evaluation of patient's condition.   23 y.o.Lutisha Johnsey's evaluation in the Emergency Department is complete. It has been determined that no acute conditions requiring further emergency intervention are present at this time. The patient/guardian have been advised of the diagnosis and plan. We have discussed signs and symptoms that warrant return to the ED, such as changes or worsening in  symptoms.  Vital signs are stable at discharge. Filed Vitals:   09/15/13 2000  BP: 107/77  Pulse: 83  Temp: 98 F (36.7 C)  Resp: 16    Patient/guardian has voiced understanding and agreed to follow-up with the PCP or specialist.   Linus Mako, PA-C 09/15/13 2005  Linus Mako, PA-C 09/15/13 2058

## 2013-09-15 NOTE — ED Notes (Signed)
Pt ambulating without difficulty, drinking fluids without a problem.  Speech is clear without any hesitation and pt verbalizes that she feels better and no longer has decreased sensation.

## 2013-09-15 NOTE — ED Notes (Signed)
Pt. States HA is getting worse

## 2013-09-15 NOTE — Discharge Instructions (Signed)
Migraine Headache A migraine headache is an intense, throbbing pain on one or both sides of your head. A migraine can last for 30 minutes to several hours. CAUSES  The exact cause of a migraine headache is not always known. However, a migraine may be caused when nerves in the brain become irritated and release chemicals that cause inflammation. This causes pain. Certain things may also trigger migraines, such as:  Alcohol.  Smoking.  Stress.  Menstruation.  Aged cheeses.  Foods or drinks that contain nitrates, glutamate, aspartame, or tyramine.  Lack of sleep.  Chocolate.  Caffeine.  Hunger.  Physical exertion.  Fatigue.  Medicines used to treat chest pain (nitroglycerine), birth control pills, estrogen, and some blood pressure medicines. SIGNS AND SYMPTOMS  Pain on one or both sides of your head.  Pulsating or throbbing pain.  Severe pain that prevents daily activities.  Pain that is aggravated by any physical activity.  Nausea, vomiting, or both.  Dizziness.  Pain with exposure to bright lights, loud noises, or activity.  General sensitivity to bright lights, loud noises, or smells. Before you get a migraine, you may get warning signs that a migraine is coming (aura). An aura may include:  Seeing flashing lights.  Seeing bright spots, halos, or zig-zag lines.  Having tunnel vision or blurred vision.  Having feelings of numbness or tingling.  Having trouble talking.  Having muscle weakness. DIAGNOSIS  A migraine headache is often diagnosed based on:  Symptoms.  Physical exam.  A CT scan or MRI of your head. These imaging tests cannot diagnose migraines, but they can help rule out other causes of headaches. TREATMENT Medicines may be given for pain and nausea. Medicines can also be given to help prevent recurrent migraines.  HOME CARE INSTRUCTIONS  Only take over-the-counter or prescription medicines for pain or discomfort as directed by your  health care provider. The use of long-term narcotics is not recommended.  Lie down in a dark, quiet room when you have a migraine.  Keep a journal to find out what may trigger your migraine headaches. For example, write down:  What you eat and drink.  How much sleep you get.  Any change to your diet or medicines.  Limit alcohol consumption.  Quit smoking if you smoke.  Get 7 9 hours of sleep, or as recommended by your health care provider.  Limit stress.  Keep lights dim if bright lights bother you and make your migraines worse. SEEK IMMEDIATE MEDICAL CARE IF:   Your migraine becomes severe.  You have a fever.  You have a stiff neck.  You have vision loss.  You have muscular weakness or loss of muscle control.  You start losing your balance or have trouble walking.  You feel faint or pass out.  You have severe symptoms that are different from your first symptoms. MAKE SURE YOU:   Understand these instructions.  Will watch your condition.  Will get help right away if you are not doing well or get worse. Document Released: 03/20/2005 Document Revised: 01/08/2013 Document Reviewed: 11/25/2012 [employer] Patient Information 2014 [employer].  

## 2013-09-17 ENCOUNTER — Encounter (HOSPITAL_COMMUNITY): Payer: Self-pay | Admitting: *Deleted

## 2013-09-17 ENCOUNTER — Inpatient Hospital Stay (HOSPITAL_COMMUNITY)
Admission: AD | Admit: 2013-09-17 | Discharge: 2013-09-17 | Disposition: A | Discharge status: Home / Self Care | Payer: BC Managed Care – PPO | Source: Ambulatory Visit | Attending: Obstetrics and Gynecology | Admitting: Obstetrics and Gynecology

## 2013-09-17 DIAGNOSIS — R102 Pelvic and perineal pain: Secondary | ICD-10-CM

## 2013-09-17 DIAGNOSIS — Z30432 Encounter for removal of intrauterine contraceptive device: Secondary | ICD-10-CM | POA: Diagnosis present

## 2013-09-17 DIAGNOSIS — N949 Unspecified condition associated with female genital organs and menstrual cycle: Secondary | ICD-10-CM | POA: Diagnosis not present

## 2013-09-17 NOTE — MAU Provider Note (Signed)
History     CSN: [id number]  Arrival date and time: 09/17/13 1117   First Provider Initiated Contact with Patient 09/17/13 1216      Chief Complaint  Patient presents with  . needs mirena removed    HPI Ms. Kristin [name] is a 23 y.o. G2P2002 who presents to MAU today for IUD removal. [facility] Monday for severe headache, loss of vision and speech. There was concern at that time for stroke, but imaging appears negative. Patient has follow-up with neurology next week. Patient states that neurologist told her to stop taking Reglan that was prescribed to help with breast-feeding and that she should have her IUD removed. Patient is convinced that IUD has caused cramping, amenorrhea, decreased milk production and significant weight gain. She states that she was unable to get an appointment in the office for removal for 2 weeks. She has mild cramping now rated at 4/10.   OB History   Grav Para Term Preterm Abortions TAB SAB Ect Mult Living   2 2 2  0 0 0 0 0 0 2      Past Medical History  Diagnosis Date  . No pertinent past medical history   . Cholecystitis   . Cholestasis of pregnancy in third trimester     Past Surgical History  Procedure Laterality Date  . No past surgeries      Family History  Problem Relation Age of Onset  . Other Neg Hx   . Alcohol abuse Neg Hx   . Asthma Neg Hx   . Arthritis Neg Hx   . Birth defects Neg Hx   . Cancer Neg Hx   . COPD Neg Hx   . Depression Neg Hx   . Diabetes Neg Hx   . Drug abuse Neg Hx   . Early death Neg Hx   . Hearing loss Neg Hx   . Heart disease Neg Hx   . Hyperlipidemia Neg Hx   . Hypertension Neg Hx   . Kidney disease Neg Hx   . Learning disabilities Neg Hx   . Mental illness Neg Hx   . Mental retardation Neg Hx   . Miscarriages / Stillbirths Neg Hx   . Stroke Neg Hx   . Vision loss Neg Hx   . Varicose Veins Neg Hx     History  Substance Use Topics  . Smoking status: Never Smoker   .  Smokeless tobacco: Never Used  . Alcohol Use: No     Comment: occasional ETOH use prior to pregnancy    Allergies: No Known Allergies  Prescriptions prior to admission  Medication Sig Dispense Refill  . acetaminophen (TYLENOL) 500 MG tablet Take 500 mg by mouth every 6 (six) hours as needed for moderate pain or headache.        Review of Systems  Constitutional: Negative for fever and malaise/fatigue.  Gastrointestinal: Positive for abdominal pain. Negative for nausea, vomiting, diarrhea and constipation.  Genitourinary:       Neg - vaginal bleeding, discharge   Physical Exam   Blood pressure 127/65, pulse 93, temperature 97.8 F (36.6 C), temperature source Oral, resp. rate 20, weight 265 lb (120.203 kg), SpO2 100.00%, currently breastfeeding.  Physical Exam  Constitutional: She is oriented to person, place, and time. She appears well-developed and well-nourished. No distress.  HENT:  Head: Normocephalic and atraumatic.  Cardiovascular: Normal rate.   Respiratory: Effort normal.  GI: Soft. She exhibits no distension and  no mass. There is tenderness (mild tenderness in the suprapubic region at midline). There is no rebound and no guarding.  Genitourinary: Vagina normal. No vaginal discharge found.  Neurological: She is alert and oriented to person, place, and time.  Skin: Skin is warm and dry. No erythema.  Psychiatric: She has a normal mood and affect.   No results found for this or any previous visit (from the past 24 hour(s)).  MAU Course  Procedures  GYNECOLOGY CLINIC PROCEDURE NOTE  IUD Removal  Patient was in the dorsal lithotomy position, normal external genitalia was noted.  A speculum was placed in the patient's vagina, normal discharge was noted, no lesions. The multiparous cervix was visualized, no lesions, no abnormal discharge.  The strings of the IUD were grasped and pulled using ring forceps. The IUD was removed in its entirety. Patient tolerated the  procedure well.    Patient will use condoms for contraception until she has consulted Neurology and her OB/Gyn.    MDM Discussed with Dr. Tenny Crawoss. Ok to remove IUD with appropriate counseling for birth control and side effects of progesterone.  Discussed at length the options available to the patient and the amount of systemic progesterone involved with each method.  Patient would like to consult neurology and OB prior to initiating a new birth control method. Patient advised that she would be able to conceive as soon as the IUD is removed. She was advised to be abstinent until new birth control method is initiated. Patient voiced understanding.   Assessment and Plan  A: Pelvic pain IUD removal   P: Discharge home Advised abstinence until new birth control is initiated Patient advised to follow-up with Neurology and Dr. Tenny Crawoss as scheduled Patient may return to MAU as needed or if her condition were to change or worsen   Freddi StarrJulie N Ethier, PA-C  09/17/2013, 12:36 PM

## 2013-09-17 NOTE — MAU Note (Signed)
Neurologist wanted her to have her mirena removed immediatly.   Called clinic and they can not get her in for 2 wks. ( thinks the Lolita Riegermriena is what is causing her problems.)

## 2013-09-17 NOTE — Discharge Instructions (Signed)
Levonorgestrel intrauterine device (IUD) What is this medicine? LEVONORGESTREL IUD (LEE voe nor jes trel) is a contraceptive (birth control) device. The device is placed inside the uterus by a healthcare professional. It is used to prevent pregnancy and can also be used to treat heavy bleeding that occurs during your period. Depending on the device, it can be used for 3 to 5 years. This medicine may be used for other purposes; ask your health care provider or pharmacist if you have questions. COMMON BRAND NAME(S): Mirena, Skyla What should I tell my health care provider before I take this medicine? They need to know if you have any of these conditions: -abnormal Pap smear -cancer of the breast, uterus, or cervix -diabetes -endometritis -genital or pelvic infection now or in the past -have more than one sexual partner or your partner has more than one partner -heart disease -history of an ectopic or tubal pregnancy -immune system problems -IUD in place -liver disease or tumor -problems with blood clots or take blood-thinners -use intravenous drugs -uterus of unusual shape -vaginal bleeding that has not been explained -an unusual or allergic reaction to levonorgestrel, other hormones, silicone, or polyethylene, medicines, foods, dyes, or preservatives -pregnant or trying to get pregnant -breast-feeding How should I use this medicine? This device is placed inside the uterus by a health care professional. Talk to your pediatrician regarding the use of this medicine in children. Special care may be needed. Overdosage: If you think you have taken too much of this medicine contact a poison control center or emergency room at once. NOTE: This medicine is only for you. Do not share this medicine with others. What if I miss a dose? This does not apply. What may interact with this medicine? Do not take this medicine with any of the following  medications: -amprenavir -bosentan -fosamprenavir This medicine may also interact with the following medications: -aprepitant -barbiturate medicines for inducing sleep or treating seizures -bexarotene -griseofulvin -medicines to treat seizures like carbamazepine, ethotoin, felbamate, oxcarbazepine, phenytoin, topiramate -modafinil -pioglitazone -rifabutin -rifampin -rifapentine -some medicines to treat HIV infection like atazanavir, indinavir, lopinavir, nelfinavir, tipranavir, ritonavir -St. John's wort -warfarin This list may not describe all possible interactions. Give your health care provider a list of all the medicines, herbs, non-prescription drugs, or dietary supplements you use. Also tell them if you smoke, drink alcohol, or use illegal drugs. Some items may interact with your medicine. What should I watch for while using this medicine? Visit your doctor or health care professional for regular check ups. See your doctor if you or your partner has sexual contact with others, becomes HIV positive, or gets a sexual transmitted disease. This product does not protect you against HIV infection (AIDS) or other sexually transmitted diseases. You can check the placement of the IUD yourself by reaching up to the top of your vagina with clean fingers to feel the threads. Do not pull on the threads. It is a good habit to check placement after each menstrual period. Call your doctor right away if you feel more of the IUD than just the threads or if you cannot feel the threads at all. The IUD may come out by itself. You may become pregnant if the device comes out. If you notice that the IUD has come out use a backup birth control method like condoms and call your health care provider. Using tampons will not change the position of the IUD and are okay to use during your period. What side effects may I   notice from receiving this medicine? Side effects that you should report to your doctor or  health care professional as soon as possible: -allergic reactions like skin rash, itching or hives, swelling of the face, lips, or tongue -fever, flu-like symptoms -genital sores -high blood pressure -no menstrual period for 6 weeks during use -pain, swelling, warmth in the leg -pelvic pain or tenderness -severe or sudden headache -signs of pregnancy -stomach cramping -sudden shortness of breath -trouble with balance, talking, or walking -unusual vaginal bleeding, discharge -yellowing of the eyes or skin Side effects that usually do not require medical attention (report to your doctor or health care professional if they continue or are bothersome): -acne -breast pain -change in sex drive or performance -changes in weight -cramping, dizziness, or faintness while the device is being inserted -headache -irregular menstrual bleeding within first 3 to 6 months of use -nausea This list may not describe all possible side effects. Call your doctor for medical advice about side effects. You may report side effects to FDA at 1-800-FDA-1088. Where should I keep my medicine? This does not apply. NOTE: This sheet is a summary. It may not cover all possible information. If you have questions about this medicine, talk to your doctor, pharmacist, or health care provider.  2014, Elsevier/Gold Standard. (2011-04-20 13:54:04)  

## 2014-02-02 ENCOUNTER — Encounter (HOSPITAL_COMMUNITY): Payer: Self-pay | Admitting: *Deleted

## 2014-02-03 LAB — OB RESULTS CONSOLE ABO/RH: RH Type: POSITIVE

## 2014-02-03 LAB — OB RESULTS CONSOLE ANTIBODY SCREEN: ANTIBODY SCREEN: NEGATIVE

## 2014-02-03 LAB — OB RESULTS CONSOLE GC/CHLAMYDIA
Chlamydia: NEGATIVE
GC PROBE AMP, GENITAL: NEGATIVE

## 2014-02-03 LAB — OB RESULTS CONSOLE HIV ANTIBODY (ROUTINE TESTING): HIV: NONREACTIVE

## 2014-02-03 LAB — OB RESULTS CONSOLE HEPATITIS B SURFACE ANTIGEN: Hepatitis B Surface Ag: NEGATIVE

## 2014-02-03 LAB — OB RESULTS CONSOLE RUBELLA ANTIBODY, IGM: RUBELLA: IMMUNE

## 2014-02-03 LAB — OB RESULTS CONSOLE RPR: RPR: NONREACTIVE

## 2014-04-03 NOTE — L&D Delivery Note (Signed)
Delivery Note At 3:31 PM a viable female was delivered via Vaginal, Spontaneous Delivery (Presentation: ; Occiput Anterior).  APGAR: 9, 9; weight 6 lb 1.4 oz (2760 g).   Placenta status: Intact, Spontaneous.  Cord: 3 vessels with the following complications: Short.  Cord pH: n/a  Anesthesia: Epidural  Episiotomy: None Lacerations: Labial--between the labia majora and labia minora (repaired) Suture Repair: 3.0 vicryl rapide Est. Blood Loss (mL): 300  Mom to postpartum.  Baby to Couplet care / Skin to Skin.  I was called by nursing at 1531 with the report that the patient was complete and wanting to push. I arrived at the bedside at 1535; however, the patient delivered with one push at 1531, immediately after I was alerted of cervical exam  and prior to my arrival.  Upon my arrival, nursing was at the bedside.  The baby's cord had been clamped and cut, and the baby was crying vigorously skin to skin with mom.  I delivered the placenta and repaired the labial laceration as above.      Kristin [name] Kristin [name] 06/29/2014, 7:06 PM

## 2014-04-19 ENCOUNTER — Encounter (HOSPITAL_COMMUNITY): Payer: Self-pay

## 2014-04-19 ENCOUNTER — Inpatient Hospital Stay (HOSPITAL_COMMUNITY)
Admission: AD | Admit: 2014-04-19 | Discharge: 2014-04-19 | Disposition: A | Discharge status: Home / Self Care | Payer: Medicaid Other | Source: Ambulatory Visit | Attending: Obstetrics and Gynecology | Admitting: Obstetrics and Gynecology

## 2014-04-19 DIAGNOSIS — O26892 Other specified pregnancy related conditions, second trimester: Secondary | ICD-10-CM

## 2014-04-19 DIAGNOSIS — M549 Dorsalgia, unspecified: Secondary | ICD-10-CM

## 2014-04-19 DIAGNOSIS — Z3A26 26 weeks gestation of pregnancy: Secondary | ICD-10-CM | POA: Diagnosis not present

## 2014-04-19 DIAGNOSIS — O9989 Other specified diseases and conditions complicating pregnancy, childbirth and the puerperium: Secondary | ICD-10-CM | POA: Insufficient documentation

## 2014-04-19 DIAGNOSIS — M79605 Pain in left leg: Secondary | ICD-10-CM | POA: Diagnosis present

## 2014-04-19 DIAGNOSIS — O99891 Other specified diseases and conditions complicating pregnancy: Secondary | ICD-10-CM

## 2014-04-19 DIAGNOSIS — M79652 Pain in left thigh: Secondary | ICD-10-CM

## 2014-04-19 LAB — URINALYSIS, ROUTINE W REFLEX MICROSCOPIC
BILIRUBIN URINE: NEGATIVE
GLUCOSE, UA: NEGATIVE mg/dL
Hgb urine dipstick: NEGATIVE
KETONES UR: NEGATIVE mg/dL
LEUKOCYTES UA: NEGATIVE
NITRITE: NEGATIVE
Protein, ur: NEGATIVE mg/dL
Specific Gravity, Urine: >1.03 — ABNORMAL HIGH (ref 1.005–1.030)
Urobilinogen, UA: 0.2 mg/dL (ref 0.0–1.0)
pH: 6 (ref 5.0–8.0)

## 2014-04-19 MED ORDER — CYCLOBENZAPRINE HCL 10 MG PO TABS: 10.0000 mg | ORAL_TABLET | Freq: Three times a day (TID) | ORAL | Status: DC | PRN

## 2014-04-19 NOTE — MAU Provider Note (Signed)
History     CSN: [id number]  Arrival date and time: 04/19/14 1508   First Provider Initiated Contact with Patient 04/19/14 1617      Chief Complaint  Patient presents with  . Leg Pain   HPI Kristin [name] 24 y.o. [redacted]w[redacted]d  Comes to MAU with pain from her hips through her left leg.  History of sitting in bed at 9 am last night and her whole left leg went numb.  She then began to lie on her right side and the left leg began to have feeling again around 2 am.  Then the leg was in pain and she is noticing that while she can walk, the left leg hurts from her low back through her thigh.  Has been having trouble sleeping at night and has to turn from side to side due to the pain in her back.  Can hardly turn herself.  Her partner helps by pushing her over on her side.    OB History    Gravida Para Term Preterm AB TAB SAB Ectopic Multiple Living   0 0 0 0 0 0 2      Past Medical History  Diagnosis Date  . No pertinent past medical history   . Cholecystitis   . Cholestasis of pregnancy in third trimester   . Preterm labor     Induced at 36 weeks for cholestasis    Past Surgical History  Procedure Laterality Date  . No past surgeries      Family History  Problem Relation Age of Onset  . Other Neg Hx   . Alcohol abuse Neg Hx   . Asthma Neg Hx   . Arthritis Neg Hx   . Birth defects Neg Hx   . Cancer Neg Hx   . COPD Neg Hx   . Depression Neg Hx   . Diabetes Neg Hx   . Drug abuse Neg Hx   . Early death Neg Hx   . Hearing loss Neg Hx   . Heart disease Neg Hx   . Hyperlipidemia Neg Hx   . Hypertension Neg Hx   . Kidney disease Neg Hx   . Learning disabilities Neg Hx   . Mental illness Neg Hx   . Mental retardation Neg Hx   . Miscarriages / Stillbirths Neg Hx   . Stroke Neg Hx   . Vision loss Neg Hx   . Varicose Veins Neg Hx     History  Substance Use Topics  . Smoking status: Never Smoker   . Smokeless tobacco: Never Used  . Alcohol Use: No     Comment:  occasional ETOH use prior to pregnancy    Allergies: No Known Allergies  Prescriptions prior to admission  Medication Sig Dispense Refill Last Dose  . acetaminophen (TYLENOL) 500 MG tablet Take 1,000 mg by mouth every 6 (six) hours as needed for moderate pain or headache.    04/19/2014 at 0500  . Prenatal Vit-Fe Fumarate-FA (PRENATAL MULTIVITAMIN) TABS tablet Take 1 tablet by mouth daily.   Past Week at Unknown time    Review of Systems  Constitutional: Negative for fever.  Gastrointestinal: Negative for nausea, vomiting and abdominal pain.  Genitourinary: Negative for dysuria.       No vaginal discharge. No vaginal bleeding. No dysuria.  Musculoskeletal: Positive for back pain.       Left leg pain  Neurological:       Sensory change in left leg.   Physical  Exam   Blood pressure 115/63, pulse 118, temperature 97.2 F (36.2 C), temperature source Oral, resp. rate 17, not currently breastfeeding.  Physical Exam  Nursing note and vitals reviewed. Constitutional: She is oriented to person, place, and time. She appears well-developed and well-nourished.  HENT:  Head: Normocephalic.  Eyes: EOM are normal.  Neck: Neck supple.  GI: Soft. There is no tenderness.  No contractions palpated  Musculoskeletal: Normal range of motion.  Client came to tears with palpation of outer upper thigh on the left leg.  Client was tearful again with direct pressure on the pubic symphysis. Leg measurements - 43.5 cm on right calf, 44 cm on left calf at same level.  Thigh measures 70 centimeters bilaterally at the same level on the legs.  Patient has difficulty moving left leg.  Moves it much more slowly. Strength tests equal in both legs.  Neurological: She is alert and oriented to person, place, and time.  Skin: Skin is warm and dry.  Psychiatric: She has a normal mood and affect.    MAU Course  Procedures  MDM Consult with Dr. Henderson CloudHorvath by phone on plan of care for this patient.  Do not think  this is a DVT.  Do think this is likely musculoskeletal pain.  Assessment and Plan  Musculoskeletal pain at 26 weeks in pregnancy  Plan Flexeril 10 mg One by mouth at bedtime and may be used up to 3 times a day if needed for back pain and left leg pain. Make an appointment to be seen in the office if your pain does not improve. Do not eat salt or foods with high salt. Be sure to drink 8 glasses of water every day to be well hydrated.  BURLESON,TERRI 04/19/2014, 4:50 PM

## 2014-04-19 NOTE — MAU Note (Signed)
No redness or swelling noted to left leg. Strong pedal pulses bilaterally. Negative Homan's sign.

## 2014-04-19 NOTE — MAU Note (Signed)
Pt states keeps getting yeast infection. Uses otc monistat.

## 2014-04-19 NOTE — Discharge Instructions (Signed)
Flexeril 10 mg One by mouth at bedtime and may be used up to 3 times a day if needed for back pain and left leg pain. Make an appointment to be seen in the office if your pain does not improve. Do not eat salt or foods with high salt. Be sure to drink 8 glasses of water every day to be well hydrated.

## 2014-04-19 NOTE — MAU Note (Signed)
Pt states left leg went completely numb last pm. Called MD and was told to come in for further eval. Was sitting up in bed when numbness occurred. Changed position and was still completely numb after 15 minutes. Denies bleeding or vag d/c changes.

## 2014-05-08 ENCOUNTER — Other Ambulatory Visit (HOSPITAL_COMMUNITY): Payer: Self-pay | Admitting: Obstetrics and Gynecology

## 2014-05-08 DIAGNOSIS — O26612 Liver and biliary tract disorders in pregnancy, second trimester: Principal | ICD-10-CM

## 2014-05-08 DIAGNOSIS — K831 Obstruction of bile duct: Secondary | ICD-10-CM

## 2014-05-12 ENCOUNTER — Other Ambulatory Visit (HOSPITAL_COMMUNITY): Payer: Self-pay | Admitting: Obstetrics and Gynecology

## 2014-05-12 ENCOUNTER — Ambulatory Visit (HOSPITAL_COMMUNITY)
Admission: RE | Admit: 2014-05-12 | Discharge: 2014-05-12 | Disposition: A | Discharge status: Home / Self Care | Payer: Medicaid Other | Source: Ambulatory Visit | Attending: Obstetrics and Gynecology | Admitting: Obstetrics and Gynecology

## 2014-05-12 DIAGNOSIS — O26613 Liver and biliary tract disorders in pregnancy, third trimester: Secondary | ICD-10-CM | POA: Diagnosis present

## 2014-05-12 DIAGNOSIS — O26612 Liver and biliary tract disorders in pregnancy, second trimester: Principal | ICD-10-CM

## 2014-05-12 DIAGNOSIS — Z3A30 30 weeks gestation of pregnancy: Secondary | ICD-10-CM | POA: Diagnosis not present

## 2014-05-12 DIAGNOSIS — K831 Obstruction of bile duct: Secondary | ICD-10-CM | POA: Insufficient documentation

## 2014-05-12 DIAGNOSIS — Z36 Encounter for antenatal screening of mother: Secondary | ICD-10-CM | POA: Diagnosis not present

## 2014-05-24 ENCOUNTER — Encounter (HOSPITAL_COMMUNITY): Payer: Self-pay | Admitting: *Deleted

## 2014-05-24 ENCOUNTER — Inpatient Hospital Stay (HOSPITAL_COMMUNITY)
Admission: AD | Admit: 2014-05-24 | Discharge: 2014-05-24 | Disposition: A | Discharge status: Home / Self Care | Payer: Medicaid Other | Source: Ambulatory Visit | Attending: Obstetrics and Gynecology | Admitting: Obstetrics and Gynecology

## 2014-05-24 DIAGNOSIS — O26899 Other specified pregnancy related conditions, unspecified trimester: Secondary | ICD-10-CM

## 2014-05-24 DIAGNOSIS — O9989 Other specified diseases and conditions complicating pregnancy, childbirth and the puerperium: Secondary | ICD-10-CM | POA: Insufficient documentation

## 2014-05-24 DIAGNOSIS — R109 Unspecified abdominal pain: Secondary | ICD-10-CM

## 2014-05-24 DIAGNOSIS — Z3A32 32 weeks gestation of pregnancy: Secondary | ICD-10-CM | POA: Insufficient documentation

## 2014-05-24 NOTE — Discharge Instructions (Signed)
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
## 2014-05-30 ENCOUNTER — Inpatient Hospital Stay (HOSPITAL_COMMUNITY)
Admission: AD | Admit: 2014-05-30 | Discharge: 2014-05-30 | Disposition: A | Discharge status: Home / Self Care | Payer: 59 | Source: Ambulatory Visit | Attending: Obstetrics and Gynecology | Admitting: Obstetrics and Gynecology

## 2014-05-30 ENCOUNTER — Encounter (HOSPITAL_COMMUNITY): Payer: Self-pay

## 2014-05-30 DIAGNOSIS — B373 Candidiasis of vulva and vagina: Secondary | ICD-10-CM | POA: Diagnosis not present

## 2014-05-30 DIAGNOSIS — O4703 False labor before 37 completed weeks of gestation, third trimester: Secondary | ICD-10-CM | POA: Diagnosis not present

## 2014-05-30 DIAGNOSIS — E860 Dehydration: Secondary | ICD-10-CM

## 2014-05-30 DIAGNOSIS — O26613 Liver and biliary tract disorders in pregnancy, third trimester: Secondary | ICD-10-CM | POA: Diagnosis not present

## 2014-05-30 DIAGNOSIS — Z3A32 32 weeks gestation of pregnancy: Secondary | ICD-10-CM | POA: Diagnosis not present

## 2014-05-30 DIAGNOSIS — B3731 Acute candidiasis of vulva and vagina: Secondary | ICD-10-CM

## 2014-05-30 DIAGNOSIS — K831 Obstruction of bile duct: Secondary | ICD-10-CM | POA: Diagnosis not present

## 2014-05-30 DIAGNOSIS — O98813 Other maternal infectious and parasitic diseases complicating pregnancy, third trimester: Secondary | ICD-10-CM | POA: Diagnosis not present

## 2014-05-30 DIAGNOSIS — O4693 Antepartum hemorrhage, unspecified, third trimester: Secondary | ICD-10-CM | POA: Diagnosis present

## 2014-05-30 LAB — WET PREP, GENITAL: TRICH WET PREP: NONE SEEN

## 2014-05-30 LAB — URINE MICROSCOPIC-ADD ON

## 2014-05-30 LAB — URINALYSIS, ROUTINE W REFLEX MICROSCOPIC
Bilirubin Urine: NEGATIVE
GLUCOSE, UA: NEGATIVE mg/dL
KETONES UR: 15 mg/dL — AB
NITRITE: NEGATIVE
PROTEIN: 30 mg/dL — AB
Specific Gravity, Urine: >1.03 — ABNORMAL HIGH (ref 1.005–1.030)
Urobilinogen, UA: 0.2 mg/dL (ref 0.0–1.0)
pH: 6 (ref 5.0–8.0)

## 2014-05-30 LAB — FETAL FIBRONECTIN: FETAL FIBRONECTIN: POSITIVE — AB

## 2014-05-30 LAB — POCT FERN TEST: POCT Fern Test: NEGATIVE

## 2014-05-30 MED ORDER — LACTATED RINGERS IV BOLUS (SEPSIS)
1000.0000 mL | Freq: Once | INTRAVENOUS | Status: AC
  Administered 2014-05-30: 1000 mL via INTRAVENOUS

## 2014-05-30 MED ORDER — NIFEDIPINE 10 MG PO CAPS
10.0000 mg | ORAL_CAPSULE | Freq: Once | ORAL | Status: AC
  Administered 2014-05-30: 10 mg via ORAL
  Filled 2014-05-30: qty 1

## 2014-05-30 MED ORDER — FLUCONAZOLE 150 MG PO TABS: ORAL_TABLET | ORAL | Status: DC

## 2014-05-30 NOTE — MAU Provider Note (Signed)
Chief Complaint: Vaginal Bleeding   First Provider Initiated Contact with Patient 05/30/14 1924     SUBJECTIVE HPI: Kristin [name] is a 24 y.o. G3P2002 at [redacted]w[redacted]d who presents with pink-red mucusy blood noted on wiping after voiding about 1 hr PTA. Looked like menstrual blood. No other bleeding episodes. Denies trauma. Last intercourse 3 days ago. No prior episodes of spotting or bleeding. Yesterday had nausea, vomiting, diarrhea. She took an antiemetic that was called in by the Dr.'s office yesterday and has been retaining food and fluids today. No diarrhea today. Had intermittent cramping that felt like contractions yesterday which has continued today.  She's noticed an increase in white vaginal discharge that is non-irritative. She reports moistness in her underwear but no gush of fluid. She denies dysuria, urinary urgency or frequency. Fetus is active.  Pregnancy Course:  Significant for morbid obesity and cholestasis of pregnancy with reassuring biweekly testing. History FGR and cholestasis  Past Medical History  Diagnosis Date  . No pertinent past medical history   . Cholecystitis   . Cholestasis of pregnancy in third trimester   . Preterm labor     Induced at 36 weeks for cholestasis   OB History  Gravida Para Term Preterm AB SAB TAB Ectopic Multiple Living  0 0 0 0 0 0 2    # Outcome Date GA Lbr Len/2nd Weight Sex Delivery Anes PTL Lv  3 Current           2 Term 05/26/13 [redacted]w[redacted]d 07:12 / 00:11 2.381 kg (5 lb 4 oz) F Vag-Spont EPI  Y     Comments: none  1 Term 12/19/11 [redacted]w[redacted]d 06:04 / 00:29 2.62 kg (5 lb 12.4 oz) F Vag-Spont EPI  Y     Comments: iugr and 2 vessel cord     Past Surgical History  Procedure Laterality Date  . No past surgeries     History   Social History  . Marital Status: Single    Spouse Name: N/A  . Number of Children: N/A  . Years of Education: N/A   Occupational History  . Not on file.   Social History Main Topics  . Smoking status: Never Smoker    . Smokeless tobacco: Never Used  . Alcohol Use: No     Comment: occasional ETOH use prior to pregnancy  . Drug Use: No  . Sexual Activity: Yes    Birth Control/ Protection: None   Other Topics Concern  . Not on file   Social History Narrative   No current facility-administered medications on file prior to encounter.   Current Outpatient Prescriptions on File Prior to Encounter  Medication Sig Dispense Refill  . Prenatal Vit-Fe Fumarate-FA (PRENATAL MULTIVITAMIN) TABS tablet Take 1 tablet by mouth daily.    . ursodiol (ACTIGALL) 300 MG capsule Take 300 mg by mouth 3 (three) times daily.    . cyclobenzaprine (FLEXERIL) 10 MG tablet Take 1 tablet (10 mg total) by mouth 3 (three) times daily as needed for muscle spasms. (Patient not taking: Reported on 05/24/2014) 30 tablet 0   No Known Allergies  ROS: Pertinent items in HPI  OBJECTIVE Blood pressure 119/81, pulse 99, temperature 97.9 F (36.6 C), temperature source Oral, resp. rate 18, height  (1.651 m), weight 121.791 kg (268 lb 8 oz), not currently breastfeeding.   GENERAL: Obese female in no acute distress, looks somewhat uncomfortable during contractions.   HEENT: Normocephalic HEART: normal rate RESP: normal effort ABDOMEN: Soft, non-tender, S=D  BACK: neg CVAT EXTREMITIES: Nontender, no edema NEURO: Alert and oriented, nonfocal SSE: NEFG, moderate gray-white clumpy discharge, no blood noted except brownish tinge on q-tip, cervix clean SVE: uterus normal size, no adnexal tenderness or masses  FHR: baseline 120-125, moderate variability, accelerations >15bpm present; variable deceleration to 90-100 x 15-20 sec x2 Toco: UCs q 3-6 min  Dilation: 1 Station:  (Long) Exam by:: D Poe CNM SVE: posterior, soft; 1/long/-2-3, cephalic; no blood on exam finger  LAB RESULTS Results for orders placed or performed during the hospital encounter of 05/30/14 (from the past 24 hour(s))  Urinalysis, Routine w reflex microscopic      Status: Abnormal   Collection Time: 05/30/14  6:48 PM  Result Value Ref Range   Color, Urine YELLOW YELLOW   APPearance CLEAR CLEAR   Specific Gravity, Urine >1.030 (H) 1.005 - 1.030   pH 6.0 5.0 - 8.0   Glucose, UA NEGATIVE NEGATIVE mg/dL   Hgb urine dipstick TRACE (A) NEGATIVE   Bilirubin Urine NEGATIVE NEGATIVE   Ketones, ur 15 (A) NEGATIVE mg/dL   Protein, ur 30 (A) NEGATIVE mg/dL   Urobilinogen, UA 0.2 0.0 - 1.0 mg/dL   Nitrite NEGATIVE NEGATIVE   Leukocytes, UA SMALL (A) NEGATIVE  Urine microscopic-add on     Status: Abnormal   Collection Time: 05/30/14  6:48 PM  Result Value Ref Range   Squamous Epithelial / LPF FEW (A) RARE   WBC, UA 7-10 <3 WBC/hpf   RBC / HPF 0-2 <3 RBC/hpf   Bacteria, UA MANY (A) RARE  Wet prep, genital     Status: Abnormal   Collection Time: 05/30/14  7:40 PM  Result Value Ref Range   Yeast Wet Prep HPF POC MANY (A) NONE SEEN   Trich, Wet Prep NONE SEEN NONE SEEN   Clue Cells Wet Prep HPF POC FEW (A) NONE SEEN   WBC, Wet Prep HPF POC MANY (A) NONE SEEN  POCT fern test     Status: None   Collection Time: 05/30/14  8:09 PM  Result Value Ref Range   POCT Fern Test Negative = intact amniotic membranes      IMAGING US Ob Detail + 14 Wk  05/12/2014   OBSTETRICAL ULTRASOUND: This exam was performed within a [facility]. The OB US report was generated in the AS system, and faxed to the ordering physician.   This report is available in the [employer]. See the AS Obstetric US report via the Image Link.  US Fetal Bpp W/o Non Stress  05/12/2014   OBSTETRICAL ULTRASOUND: This exam was performed within a [facility]. The OB US report was generated in the AS system, and faxed to the ordering physician.   This report is available in the [employer]. See the [facility] Obstetric US report via the Image Link.   MAU COURSE IV LR 10000 bolus Procardia  po given at 2000; Procardia  at 2030 fFN collected 2055: still  feels pain with UCs> will continue IVF, send fFN and recheck cx if not improved C/W Dr. Henderson Cloud who agrees with plan Norval Morton, CNm assumed care at 2105  ASSESSMENT  G3P2002 at [redacted]w[redacted]d 1. Preterm contractions, third trimester   2. Mild dehydration   3. Yeast vaginitis     PLAN Discharge home with PTL precautions, [facility]    Medication List    STOP taking these medications        cyclobenzaprine 10 MG tablet  Commonly known as:  FLEXERIL      TAKE these medications        fluconazole 150 MG tablet  Commonly known as:  DIFLUCAN  Take 1 now and repeat in 2 days if needed     prenatal multivitamin Tabs tablet  Take 1 tablet by mouth daily.     PRESCRIPTION MEDICATION  Nausea med per Dr Henderson CloudHorvath     ursodiol 300 MG capsule  Commonly known as:  ACTIGALL  Take 300 mg by mouth 3 (three) times daily.       Follow-up Information    Follow up with Syrai Gladwin A, MD.   Specialty:  Obstetrics and Gynecology   Why:  Keep your scheduled prenatal appointment   Contact information:   [address] [place] [id number] [phone]        Danae Orleanseirdre C Poe, CNM 05/30/2014  7:28 PM  2145 Pt reports decrease in feeling contractions.  2nd bag of IV fluids infusing.  FFN pending.  2200 FFN positive; cervical check 1/long (no change); FHR 130's, +accels, Toco - q 3-10 min consulted with Dr. Henderson CloudHorvath > reviewed labs/patient status (appears comfortable, reports not feeling contractions) > no further action, give preterm labor precautions, if patient presents again and has cervical change BMZ will be given.  A: 24 y.o. G3P2002 at [id number] IUP Preterm Contractions - stable cervix Cholestasis of Pregnancy Reactive NST Yeast Infection  Plan: Discharge to home Reviewed preterm labor precautions and when to return RX Diflucan Follow-up in office next week  Walidah Kennith GainN Karim, CNM

## 2014-05-30 NOTE — Discharge Instructions (Signed)
Dehydration, Adult Dehydration is when you lose more fluids from the body than you take in. Vital organs like the kidneys, brain, and heart cannot function without a proper amount of fluids and salt. Any loss of fluids from the body can cause dehydration.  CAUSES   Vomiting.  Diarrhea.  Excessive sweating.  Excessive urine output.  Fever. SYMPTOMS  Mild dehydration 1. Thirst. 2. Dry lips. 3. Slightly dry mouth. Moderate dehydration  Very dry mouth.  Sunken eyes.  Skin does not bounce back quickly when lightly pinched and released.  Dark urine and decreased urine production.  Decreased tear production.  Headache. Severe dehydration  Very dry mouth.  Extreme thirst.  Rapid, weak pulse (more than 100 beats per minute at rest).  Cold hands and feet.  Not able to sweat in spite of heat and temperature.  Rapid breathing.  Blue lips.  Confusion and lethargy.  Difficulty being awakened.  Minimal urine production.  No tears. DIAGNOSIS  Your caregiver will diagnose dehydration based on your symptoms and your exam. Blood and urine tests will help confirm the diagnosis. The diagnostic evaluation should also identify the cause of dehydration. TREATMENT  Treatment of mild or moderate dehydration can often be done at home by increasing the amount of fluids that you drink. It is best to drink small amounts of fluid more often. Drinking too much at one time can make vomiting worse. Refer to the home care instructions below. Severe dehydration needs to be treated at the hospital where you will probably be given intravenous (IV) fluids that contain water and electrolytes. HOME CARE INSTRUCTIONS   Ask your caregiver about specific rehydration instructions.  Drink enough fluids to keep your urine clear or pale yellow.  Drink small amounts frequently if you have nausea and vomiting.  Eat as you normally do.  Avoid:  Foods or drinks high in sugar.  Carbonated  drinks.  Juice.  Extremely hot or cold fluids.  Drinks with caffeine.  Fatty, greasy foods.  Alcohol.  Tobacco.  Overeating.  Gelatin desserts.  Wash your hands well to avoid spreading bacteria and viruses.  Only take over-the-counter or prescription medicines for pain, discomfort, or fever as directed by your caregiver.  Ask your caregiver if you should continue all prescribed and over-the-counter medicines.  Keep all follow-up appointments with your caregiver. SEEK MEDICAL CARE IF:  You have abdominal pain and it increases or stays in one area (localizes).  You have a rash, stiff neck, or severe headache.  You are irritable, sleepy, or difficult to awaken.  You are weak, dizzy, or extremely thirsty. SEEK IMMEDIATE MEDICAL CARE IF:   You are unable to keep fluids down or you get worse despite treatment.  You have frequent episodes of vomiting or diarrhea.  You have blood or green matter (bile) in your vomit.  You have blood in your stool or your stool looks black and tarry.  You have not urinated in 6 to 8 hours, or you have only urinated a small amount of very dark urine.  You have a fever.  You faint. MAKE SURE YOU:   Understand these instructions.  Will watch your condition.  Will get help right away if you are not doing well or get worse. Document Released: 03/20/2005 Document Revised: 06/12/2011 Document Reviewed: 11/07/2010 [facility] Patient Information 2015 [place], [place]. This information is not intended to replace advice given to you by your health care provider. Make sure you discuss any questions you have with your health care  provider.  Braxton Hicks Contractions Contractions of the uterus can occur throughout pregnancy. Contractions are not always a sign that you are in labor.  WHAT ARE BRAXTON HICKS CONTRACTIONS?  Contractions that occur before labor are called Braxton Hicks contractions, or false labor. Toward the end of pregnancy (32-34  weeks), these contractions can develop more often and may become more forceful. This is not true labor because these contractions do not result in opening (dilatation) and thinning of the cervix. They are sometimes difficult to tell apart from true labor because these contractions can be forceful and people have different pain tolerances. You should not feel embarrassed if you go to the hospital with false labor. Sometimes, the only way to tell if you are in true labor is for your health care provider to look for changes in the cervix. If there are no prenatal problems or other health problems associated with the pregnancy, it is completely safe to be sent home with false labor and await the onset of true labor. HOW CAN YOU TELL THE DIFFERENCE BETWEEN TRUE AND FALSE LABOR? False Labor  The contractions of false labor are usually shorter and not as hard as those of true labor.   The contractions are usually irregular.   The contractions are often felt in the front of the lower abdomen and in the groin.   The contractions may go away when you walk around or change positions while lying down.   The contractions get weaker and are shorter lasting as time goes on.   The contractions do not usually become progressively stronger, regular, and closer together as with true labor.  True Labor 4. Contractions in true labor last 30-70 seconds, become very regular, usually become more intense, and increase in frequency.  5. The contractions do not go away with walking.  6. The discomfort is usually felt in the top of the uterus and spreads to the lower abdomen and low back.  7. True labor can be determined by your health care provider with an exam. This will show that the cervix is dilating and getting thinner.  WHAT TO REMEMBER  Keep up with your usual exercises and follow other instructions given by your health care provider.   Take medicines as directed by your health care provider.    Keep your regular prenatal appointments.   Eat and drink lightly if you think you are going into labor.   If Braxton Hicks contractions are making you uncomfortable:   Change your position from lying down or resting to walking, or from walking to resting.   Sit and rest in a tub of warm water.   Drink 2-3 glasses of water. Dehydration may cause these contractions.   Do slow and deep breathing several times an hour.  WHEN SHOULD I SEEK IMMEDIATE MEDICAL CARE? Seek immediate medical care if:  Your contractions become stronger, more regular, and closer together.   You have fluid leaking or gushing from your vagina.   You have a fever.   You pass blood-tinged mucus.   You have vaginal bleeding.   You have continuous abdominal pain.   You have low back pain that you never had before.   You feel your baby's head pushing down and causing pelvic pressure.   Your baby is not moving as much as it used to.  Document Released: 03/20/2005 Document Revised: 03/25/2013 Document Reviewed: 12/30/2012 [facility] Patient Information 2015 [place]. This information is not intended to replace advice given to you by  your health care provider. Make sure you discuss any questions you have with your health care provider. Fetal Movement Counts Patient Name: __________________________________________________ Patient Due Date: ____________________ Performing a fetal movement count is highly recommended in high-risk pregnancies, but it is good for every pregnant woman to do. Your health care provider may ask you to start counting fetal movements at 28 weeks of the pregnancy. Fetal movements often increase:  After eating a full meal.  After physical activity.  After eating or drinking something sweet or cold.  At rest. Pay attention to when you feel the baby is most active. This will help you notice a pattern of your baby's sleep and wake cycles and what factors contribute  to an increase in fetal movement. It is important to perform a fetal movement count at the same time each day when your baby is normally most active.  HOW TO COUNT FETAL MOVEMENTS 8. Find a quiet and comfortable area to sit or lie down on your left side. Lying on your left side provides the best blood and oxygen circulation to your baby. 9. Write down the day and time on a sheet of paper or in a journal. 10. Start counting kicks, flutters, swishes, rolls, or jabs in a 2-hour period. You should feel at least 10 movements within 2 hours. 11. If you do not feel 10 movements in 2 hours, wait 2-3 hours and count again. Look for a change in the pattern or not enough counts in 2 hours. SEEK MEDICAL CARE IF:  You feel less than 10 counts in 2 hours, tried twice.  There is no movement in over an hour.  The pattern is changing or taking longer each day to reach 10 counts in 2 hours.  You feel the baby is not moving as he or she usually does. Date: ____________ Movements: ____________ Start time: ____________ Doreatha Martin time: ____________  Date: ____________ Movements: ____________ Start time: ____________ Doreatha Martin time: ____________ Date: ____________ Movements: ____________ Start time: ____________ Doreatha Martin time: ____________ Date: ____________ Movements: ____________ Start time: ____________ Doreatha Martin time: ____________ Date: ____________ Movements: ____________ Start time: ____________ Doreatha Martin time: ____________ Date: ____________ Movements: ____________ Start time: ____________ Doreatha Martin time: ____________ Date: ____________ Movements: ____________ Start time: ____________ Doreatha Martin time: ____________ Date: ____________ Movements: ____________ Start time: ____________ Doreatha Martin time: ____________  Date: ____________ Movements: ____________ Start time: ____________ Doreatha Martin time: ____________ Date: ____________ Movements: ____________ Start time: ____________ Doreatha Martin time: ____________ Date: ____________ Movements:  ____________ Start time: ____________ Doreatha Martin time: ____________ Date: ____________ Movements: ____________ Start time: ____________ Doreatha Martin time: ____________ Date: ____________ Movements: ____________ Start time: ____________ Doreatha Martin time: ____________ Date: ____________ Movements: ____________ Start time: ____________ Doreatha Martin time: ____________ Date: ____________ Movements: ____________ Start time: ____________ Doreatha Martin time: ____________  Date: ____________ Movements: ____________ Start time: ____________ Doreatha Martin time: ____________ Date: ____________ Movements: ____________ Start time: ____________ Doreatha Martin time: ____________ Date: ____________ Movements: ____________ Start time: ____________ Doreatha Martin time: ____________ Date: ____________ Movements: ____________ Start time: ____________ Doreatha Martin time: ____________ Date: ____________ Movements: ____________ Start time: ____________ Doreatha Martin time: ____________ Date: ____________ Movements: ____________ Start time: ____________ Doreatha Martin time: ____________ Date: ____________ Movements: ____________ Start time: ____________ Doreatha Martin time: ____________  Date: ____________ Movements: ____________ Start time: ____________ Doreatha Martin time: ____________ Date: ____________ Movements: ____________ Start time: ____________ Doreatha Martin time: ____________ Date: ____________ Movements: ____________ Start time: ____________ Doreatha Martin time: ____________ Date: ____________ Movements: ____________ Start time: ____________ Doreatha Martin time: ____________ Date: ____________ Movements: ____________ Start time: ____________ Doreatha Martin time: ____________ Date: ____________ Movements: ____________ Start time: ____________ Doreatha Martin time: ____________ Date: ____________  Movements: ____________ Start time: ____________ Doreatha Martin time: ____________  Date: ____________ Movements: ____________ Start time: ____________ Doreatha Martin time: ____________ Date: ____________ Movements: ____________ Start time: ____________ Doreatha Martin  time: ____________ Date: ____________ Movements: ____________ Start time: ____________ Doreatha Martin time: ____________ Date: ____________ Movements: ____________ Start time: ____________ Doreatha Martin time: ____________ Date: ____________ Movements: ____________ Start time: ____________ Doreatha Martin time: ____________ Date: ____________ Movements: ____________ Start time: ____________ Doreatha Martin time: ____________ Date: ____________ Movements: ____________ Start time: ____________ Doreatha Martin time: ____________  Date: ____________ Movements: ____________ Start time: ____________ Doreatha Martin time: ____________ Date: ____________ Movements: ____________ Start time: ____________ Doreatha Martin time: ____________ Date: ____________ Movements: ____________ Start time: ____________ Doreatha Martin time: ____________ Date: ____________ Movements: ____________ Start time: ____________ Doreatha Martin time: ____________ Date: ____________ Movements: ____________ Start time: ____________ Doreatha Martin time: ____________ Date: ____________ Movements: ____________ Start time: ____________ Doreatha Martin time: ____________ Date: ____________ Movements: ____________ Start time: ____________ Doreatha Martin time: ____________  Date: ____________ Movements: ____________ Start time: ____________ Doreatha Martin time: ____________ Date: ____________ Movements: ____________ Start time: ____________ Doreatha Martin time: ____________ Date: ____________ Movements: ____________ Start time: ____________ Doreatha Martin time: ____________ Date: ____________ Movements: ____________ Start time: ____________ Doreatha Martin time: ____________ Date: ____________ Movements: ____________ Start time: ____________ Doreatha Martin time: ____________ Date: ____________ Movements: ____________ Start time: ____________ Doreatha Martin time: ____________ Date: ____________ Movements: ____________ Start time: ____________ Doreatha Martin time: ____________  Date: ____________ Movements: ____________ Start time: ____________ Doreatha Martin time: ____________ Date: ____________  Movements: ____________ Start time: ____________ Doreatha Martin time: ____________ Date: ____________ Movements: ____________ Start time: ____________ Doreatha Martin time: ____________ Date: ____________ Movements: ____________ Start time: ____________ Doreatha Martin time: ____________ Date: ____________ Movements: ____________ Start time: ____________ Doreatha Martin time: ____________ Date: ____________ Movements: ____________ Start time: ____________ Doreatha Martin time: ____________ Document Released: 04/19/2006 Document Revised: 08/04/2013 Document Reviewed: 01/15/2012 [employer] Patient Information 2015 [employer]. This information is not intended to replace advice given to you by your health care provider. Make sure you discuss any questions you have with your health care provider.

## 2014-05-30 NOTE — MAU Note (Signed)
Pt states called MD office yesterday for n/v/d. THis am saw pink when wiping post voiding. Noted more blood later on, and Dr. Henderson CloudHorvath told pt to come for eval. Having lower abd cramping bilaterally.

## 2014-06-16 LAB — OB RESULTS CONSOLE GBS: GBS: POSITIVE

## 2014-06-17 ENCOUNTER — Telehealth (HOSPITAL_COMMUNITY): Payer: Self-pay | Admitting: *Deleted

## 2014-06-17 NOTE — Telephone Encounter (Signed)
Preadmission screen  

## 2014-06-29 ENCOUNTER — Encounter (HOSPITAL_COMMUNITY): Payer: Self-pay

## 2014-06-29 ENCOUNTER — Inpatient Hospital Stay (HOSPITAL_COMMUNITY): Payer: 59 | Admitting: Anesthesiology

## 2014-06-29 ENCOUNTER — Inpatient Hospital Stay (HOSPITAL_COMMUNITY)
Admission: RE | Admit: 2014-06-29 | Discharge: 2014-07-01 | DRG: 775 | Disposition: A | Discharge status: Home / Self Care | Payer: 59 | Source: Ambulatory Visit | Attending: Obstetrics | Admitting: Obstetrics

## 2014-06-29 DIAGNOSIS — O99214 Obesity complicating childbirth: Secondary | ICD-10-CM | POA: Diagnosis present

## 2014-06-29 DIAGNOSIS — O2662 Liver and biliary tract disorders in childbirth: Secondary | ICD-10-CM | POA: Diagnosis present

## 2014-06-29 DIAGNOSIS — K831 Obstruction of bile duct: Secondary | ICD-10-CM | POA: Diagnosis present

## 2014-06-29 DIAGNOSIS — Z3A37 37 weeks gestation of pregnancy: Secondary | ICD-10-CM | POA: Diagnosis present

## 2014-06-29 DIAGNOSIS — Z6841 Body Mass Index (BMI) 40.0 and over, adult: Secondary | ICD-10-CM | POA: Diagnosis not present

## 2014-06-29 DIAGNOSIS — O99824 Streptococcus B carrier state complicating childbirth: Secondary | ICD-10-CM | POA: Diagnosis present

## 2014-06-29 DIAGNOSIS — O26649 Intrahepatic cholestasis of pregnancy, unspecified trimester: Secondary | ICD-10-CM | POA: Diagnosis present

## 2014-06-29 LAB — COMPREHENSIVE METABOLIC PANEL
ALT: 122 U/L — ABNORMAL HIGH (ref 0–35)
ANION GAP: 7 (ref 5–15)
AST: 105 U/L — AB (ref 0–37)
Albumin: 2.5 g/dL — ABNORMAL LOW (ref 3.5–5.2)
Alkaline Phosphatase: 170 U/L — ABNORMAL HIGH (ref 39–117)
BILIRUBIN TOTAL: 0.3 mg/dL (ref 0.3–1.2)
BUN: 6 mg/dL (ref 6–23)
CALCIUM: 8.4 mg/dL (ref 8.4–10.5)
CHLORIDE: 108 mmol/L (ref 96–112)
CO2: 22 mmol/L (ref 19–32)
Creatinine, Ser: 0.47 mg/dL — ABNORMAL LOW (ref 0.50–1.10)
GFR calc Af Amer: >90 mL/min (ref >90)
GFR calc non Af Amer: >90 mL/min (ref >90)
Glucose, Bld: 88 mg/dL (ref 70–99)
Potassium: 3.5 mmol/L (ref 3.5–5.1)
Sodium: 137 mmol/L (ref 135–145)
Total Protein: 6.1 g/dL (ref 6.0–8.3)

## 2014-06-29 LAB — CBC
HCT: 33.1 % — ABNORMAL LOW (ref 36.0–46.0)
HEMOGLOBIN: 11.3 g/dL — AB (ref 12.0–15.0)
MCH: 30.1 pg (ref 26.0–34.0)
MCHC: 34.1 g/dL (ref 30.0–36.0)
MCV: 88.3 fL (ref 78.0–100.0)
PLATELETS: 245 10*3/uL (ref 150–400)
RBC: 3.75 MIL/uL — AB (ref 3.87–5.11)
RDW: 15.1 % (ref 11.5–15.5)
WBC: 5.9 10*3/uL (ref 4.0–10.5)

## 2014-06-29 LAB — SAVE SMEAR

## 2014-06-29 LAB — RPR: RPR Ser Ql: NONREACTIVE

## 2014-06-29 LAB — PROTIME-INR
INR: 0.96 (ref 0.00–1.49)
Prothrombin Time: 12.9 seconds (ref 11.6–15.2)

## 2014-06-29 LAB — APTT: aPTT: 30 seconds (ref 24–37)

## 2014-06-29 LAB — TYPE AND SCREEN
ABO/RH(D): O POS
Antibody Screen: NEGATIVE

## 2014-06-29 LAB — FIBRINOGEN: Fibrinogen: 473 mg/dL (ref 204–475)

## 2014-06-29 MED ORDER — PHENYLEPHRINE 40 MCG/ML (10ML) SYRINGE FOR IV PUSH (FOR BLOOD PRESSURE SUPPORT)
80.0000 ug | PREFILLED_SYRINGE | INTRAVENOUS | Status: DC | PRN
  Filled 2014-06-29: qty 2

## 2014-06-29 MED ORDER — OXYCODONE-ACETAMINOPHEN 5-325 MG PO TABS
1.0000 | ORAL_TABLET | ORAL | Status: DC | PRN
  Administered 2014-06-29 – 2014-06-30 (×5): 1 via ORAL
  Filled 2014-06-29 (×5): qty 1

## 2014-06-29 MED ORDER — WITCH HAZEL-GLYCERIN EX PADS: 1.0000 "application " | MEDICATED_PAD | CUTANEOUS | Status: DC | PRN

## 2014-06-29 MED ORDER — OXYTOCIN BOLUS FROM INFUSION: 500.0000 mL | INTRAVENOUS | Status: DC

## 2014-06-29 MED ORDER — FENTANYL 2.5 MCG/ML BUPIVACAINE 1/10 % EPIDURAL INFUSION (WH - ANES)
14.0000 mL/h | INTRAMUSCULAR | Status: DC | PRN
  Filled 2014-06-29: qty 125

## 2014-06-29 MED ORDER — CITRIC ACID-SODIUM CITRATE 334-500 MG/5ML PO SOLN: 30.0000 mL | ORAL | Status: DC | PRN

## 2014-06-29 MED ORDER — EPHEDRINE 5 MG/ML INJ
10.0000 mg | INTRAVENOUS | Status: DC | PRN
  Filled 2014-06-29: qty 2

## 2014-06-29 MED ORDER — PENICILLIN G POTASSIUM 5000000 UNITS IJ SOLR
2.5000 10*6.[IU] | INTRAMUSCULAR | Status: DC
  Administered 2014-06-29: 2.5 10*6.[IU] via INTRAVENOUS
  Filled 2014-06-29 (×5): qty 2.5

## 2014-06-29 MED ORDER — FENTANYL 2.5 MCG/ML BUPIVACAINE 1/10 % EPIDURAL INFUSION (WH - ANES)
INTRAMUSCULAR | Status: DC | PRN
  Administered 2014-06-29: 14 mL/h via EPIDURAL

## 2014-06-29 MED ORDER — LACTATED RINGERS IV SOLN: 500.0000 mL | INTRAVENOUS | Status: DC | PRN

## 2014-06-29 MED ORDER — LACTATED RINGERS IV SOLN: 500.0000 mL | Freq: Once | INTRAVENOUS | Status: DC

## 2014-06-29 MED ORDER — LACTATED RINGERS IV SOLN
INTRAVENOUS | Status: DC
  Administered 2014-06-29: 11:00:00 via INTRAVENOUS

## 2014-06-29 MED ORDER — SENNOSIDES-DOCUSATE SODIUM 8.6-50 MG PO TABS
2.0000 | ORAL_TABLET | ORAL | Status: DC
  Administered 2014-06-30 (×2): 2 via ORAL
  Filled 2014-06-29 (×2): qty 2

## 2014-06-29 MED ORDER — ONDANSETRON HCL 4 MG/2ML IJ SOLN: 4.0000 mg | INTRAMUSCULAR | Status: DC | PRN

## 2014-06-29 MED ORDER — DIPHENHYDRAMINE HCL 25 MG PO CAPS: 25.0000 mg | ORAL_CAPSULE | Freq: Four times a day (QID) | ORAL | Status: DC | PRN

## 2014-06-29 MED ORDER — LIDOCAINE HCL (PF) 1 % IJ SOLN
INTRAMUSCULAR | Status: DC | PRN
  Administered 2014-06-29 (×2): 4 mL

## 2014-06-29 MED ORDER — PENICILLIN G POTASSIUM 5000000 UNITS IJ SOLR
5.0000 10*6.[IU] | Freq: Once | INTRAVENOUS | Status: AC
  Administered 2014-06-29: 5 10*6.[IU] via INTRAVENOUS
  Filled 2014-06-29: qty 5

## 2014-06-29 MED ORDER — OXYCODONE-ACETAMINOPHEN 5-325 MG PO TABS: 2.0000 | ORAL_TABLET | ORAL | Status: DC | PRN

## 2014-06-29 MED ORDER — DIBUCAINE 1 % RE OINT: 1.0000 "application " | TOPICAL_OINTMENT | RECTAL | Status: DC | PRN

## 2014-06-29 MED ORDER — PHENYLEPHRINE 40 MCG/ML (10ML) SYRINGE FOR IV PUSH (FOR BLOOD PRESSURE SUPPORT)
80.0000 ug | PREFILLED_SYRINGE | INTRAVENOUS | Status: DC | PRN
  Filled 2014-06-29: qty 20
  Filled 2014-06-29: qty 2

## 2014-06-29 MED ORDER — SIMETHICONE 80 MG PO CHEW: 80.0000 mg | CHEWABLE_TABLET | ORAL | Status: DC | PRN

## 2014-06-29 MED ORDER — TETANUS-DIPHTH-ACELL PERTUSSIS 5-2.5-18.5 LF-MCG/0.5 IM SUSP: 0.5000 mL | Freq: Once | INTRAMUSCULAR | Status: DC

## 2014-06-29 MED ORDER — ONDANSETRON HCL 4 MG/2ML IJ SOLN: 4.0000 mg | Freq: Four times a day (QID) | INTRAMUSCULAR | Status: DC | PRN

## 2014-06-29 MED ORDER — BENZOCAINE-MENTHOL 20-0.5 % EX AERO
1.0000 "application " | INHALATION_SPRAY | CUTANEOUS | Status: DC | PRN
  Administered 2014-06-29: 1 via TOPICAL
  Filled 2014-06-29: qty 56

## 2014-06-29 MED ORDER — ACETAMINOPHEN 325 MG PO TABS: 650.0000 mg | ORAL_TABLET | ORAL | Status: DC | PRN

## 2014-06-29 MED ORDER — OXYTOCIN 40 UNITS IN LACTATED RINGERS INFUSION - SIMPLE MED: 62.5000 mL/h | INTRAVENOUS | Status: DC

## 2014-06-29 MED ORDER — ONDANSETRON HCL 4 MG PO TABS: 4.0000 mg | ORAL_TABLET | ORAL | Status: DC | PRN

## 2014-06-29 MED ORDER — LIDOCAINE HCL (PF) 1 % IJ SOLN
30.0000 mL | INTRAMUSCULAR | Status: DC | PRN
Start: 2014-06-29 — End: 2014-06-29
  Filled 2014-06-29: qty 30

## 2014-06-29 MED ORDER — IBUPROFEN 600 MG PO TABS
600.0000 mg | ORAL_TABLET | Freq: Four times a day (QID) | ORAL | Status: DC
  Administered 2014-06-29 – 2014-07-01 (×8): 600 mg via ORAL
  Filled 2014-06-29 (×8): qty 1

## 2014-06-29 MED ORDER — BUTORPHANOL TARTRATE 1 MG/ML IJ SOLN: 1.0000 mg | INTRAMUSCULAR | Status: DC | PRN

## 2014-06-29 MED ORDER — OXYCODONE-ACETAMINOPHEN 5-325 MG PO TABS: 1.0000 | ORAL_TABLET | ORAL | Status: DC | PRN

## 2014-06-29 MED ORDER — PRENATAL MULTIVITAMIN CH
1.0000 | ORAL_TABLET | Freq: Every day | ORAL | Status: DC
  Administered 2014-06-30 – 2014-07-01 (×2): 1 via ORAL
  Filled 2014-06-29 (×2): qty 1

## 2014-06-29 MED ORDER — DIPHENHYDRAMINE HCL 50 MG/ML IJ SOLN: 12.5000 mg | INTRAMUSCULAR | Status: DC | PRN

## 2014-06-29 MED ORDER — OXYTOCIN 40 UNITS IN LACTATED RINGERS INFUSION - SIMPLE MED
1.0000 m[IU]/min | INTRAVENOUS | Status: DC
  Administered 2014-06-29: 2 m[IU]/min via INTRAVENOUS
  Filled 2014-06-29: qty 1000

## 2014-06-29 MED ORDER — ACETAMINOPHEN 325 MG PO TABS
650.0000 mg | ORAL_TABLET | ORAL | Status: DC | PRN
  Administered 2014-06-29: 650 mg via ORAL
  Filled 2014-06-29: qty 2

## 2014-06-29 MED ORDER — TERBUTALINE SULFATE 1 MG/ML IJ SOLN
0.2500 mg | Freq: Once | INTRAMUSCULAR | Status: DC | PRN
  Filled 2014-06-29: qty 1

## 2014-06-29 MED ORDER — LANOLIN HYDROUS EX OINT: TOPICAL_OINTMENT | CUTANEOUS | Status: DC | PRN

## 2014-06-29 NOTE — Anesthesia Preprocedure Evaluation (Signed)
Anesthesia Evaluation  Patient identified by MRN, date of birth, ID band Patient awake    Reviewed: Allergy & Precautions, NPO status , Patient's Chart, lab work & pertinent test results  History of Anesthesia Complications Negative for: history of anesthetic complications  Airway Mallampati: III  TM Distance: >3 FB Neck ROM: Full    Dental no notable dental hx. (+) Dental Advisory Given   Pulmonary neg pulmonary ROS,  breath sounds clear to auscultation  Pulmonary exam normal       Cardiovascular negative cardio ROS  Rhythm:Regular Rate:Normal     Neuro/Psych negative neurological ROS  negative psych ROS   GI/Hepatic negative GI ROS, Neg liver ROS,   Endo/Other  Morbid obesity  Renal/GU negative Renal ROS  negative genitourinary   Musculoskeletal negative musculoskeletal ROS (+)   Abdominal (+) + obese,   Peds negative pediatric ROS (+)  Hematology negative hematology ROS (+)   Anesthesia Other Findings   Reproductive/Obstetrics (+) Pregnancy                             Anesthesia Physical Anesthesia Plan  ASA: III  Anesthesia Plan: Epidural   Post-op Pain Management:    Induction:   Airway Management Planned:   Additional Equipment:   Intra-op Plan:   Post-operative Plan:   Informed Consent: I have reviewed the patients History and Physical, chart, labs and discussed the procedure including the risks, benefits and alternatives for the proposed anesthesia with the patient or authorized representative who has indicated his/her understanding and acceptance.   Dental advisory given  Plan Discussed with: CRNA  Anesthesia Plan Comments:         Anesthesia Quick Evaluation

## 2014-06-29 NOTE — Lactation Note (Signed)
This note was copied from the chart of Boy Loma MessingMiresha Cancio. Lactation Consultation Note  Patient Name: Boy Loma MessingMiresha Morre [id number]'BToday's Date: 06/29/2014 Reason for consult: Initial assessment of this mom and baby at 4 hours pp.  This is mother's third baby and she was able to exclusively breastfeed first child, now 632 yo for 14 months, but second child was smaller and had latching difficulty and mom combined breastfeeding and formula feeding for her.  Second child is 221 yo.  LC observed mom independently latch her newborn to (R) breast in cradle position and mom supported breast (large/soft) and stimulated baby to open wide (mom has large/wide nipples). Baby achieved deep latch and began rhythmical sucking bursts with swallows.  Mom denies any discomfort with this baby.  LC encouraged STS and cue feedings ad lib.   Maternal Data Formula Feeding for Exclusion: No Has patient been taught Hand Expression?: Yes (experienced mom; states she knows how to hand express) Does the patient have breastfeeding experience prior to this delivery?: Yes  Feeding Feeding Type: Breast Fed Length of feed: 7 min  LATCH Score/Interventions Latch: Grasps breast easily, tongue down, lips flanged, rhythmical sucking.  Audible Swallowing: Spontaneous and intermittent  Type of Nipple: Everted at rest and after stimulation (large/wide nipples but baby able to grasp areola after a few attempts)  Comfort (Breast/Nipple): Soft / non-tender     Hold (Positioning): No assistance needed to correctly position infant at breast.  LATCH Score: 10  Lactation Tools Discussed/Used   STS, cue feedings, hand expression  Consult Status Consult Status: Follow-up Date: 06/30/14 Follow-up type: In-patient    Warrick ParisianBryant, Kairee Kozma [facility] 06/29/2014, 8:00 PM

## 2014-06-29 NOTE — H&P (Signed)
24 y.o. [id number] @ 3270w0d presents for induction of labor for cholestasis of pregnancy.  Has been associated with elevated LFTs (most recently AST/ALT 241/177).  C/o significant itching, though improved today.   Otherwise has good fetal movement and no bleeding.  Occ ctx, mild.   Pregnancy c/b: 1. Cholestasis of pregnancy as above 2. Short interval pregnancy (last delivery 05/26/13)   Past Medical History  Diagnosis Date  . Cholestasis of pregnancy in third trimester     Past Surgical History  Procedure Laterality Date  . No past surgeries      OB History  Gravida Para Term Preterm AB SAB TAB Ectopic Multiple Living  3 2 2  0 0 0 0 0 0 2    # Outcome Date GA Lbr Len/2nd Weight Sex Delivery Anes PTL Lv  3 Current           2 Term 05/26/13 2755w1d 07:12 / 00:11 2.381 kg (5 lb 4 oz) F Vag-Spont EPI  Y     Comments: none  1 Term 12/19/11 2640w3d 06:04 / 00:29 2.62 kg (5 lb 12.4 oz) F Vag-Spont EPI  Y     Comments: iugr and 2 vessel cord      History   Social History  . Marital Status: Single    Spouse Name: N/A  . Number of Children: N/A  . Years of Education: N/A   Occupational History  . Not on file.   Social History Main Topics  . Smoking status: Never Smoker   . Smokeless tobacco: Never Used  . Alcohol Use: No     Comment: occasional ETOH use prior to pregnancy  . Drug Use: No  . Sexual Activity: Yes    Birth Control/ Protection: None   Other Topics Concern  . Not on file   Social History Narrative   Review of patient's allergies indicates no known allergies.    Prenatal Transfer Tool  Maternal Diabetes: No Genetic Screening: Normal Maternal Ultrasounds/Referrals: Normal Fetal Ultrasounds or other Referrals:  None Maternal Substance Abuse:  No Significant Maternal Medications:  Meds include: Other:  ursodiol, benadryl Significant Maternal Lab Results: Lab values include: Group B Strep positive, Other:   AST 241/ALT 177, BA ABO, Rh: O/Positive/-- (11/03  0000) Antibody: Negative (11/03 0000) Rubella:  Immune RPR: Nonreactive (11/03 0000)  HBsAg: Negative (11/03 0000)  HIV: Non-reactive (11/03 0000)  GBS: Positive (03/15 0000)    Other PNC: uncomplicated.    Filed Vitals:   06/29/14 0756  BP: 120/73  Pulse: 98  Temp: 97.5 F (36.4 C)     General:  NAD Lungs: CTAB Cardiac: RRR Abdomen:  soft, gravid, EFW 6# Ex:  trace edema SVE:  3/50/soft/-2/posterior FHTs:  130s, mod var, + accels, no decels Toco:  q8-12 min   A/P   24 y.o. 2470w0d  [id number] presents for IOL 2/2 cholestasis of pregnancy. Will check CMP/coags given elevated LFTs IOL: cervix favorable, pitocin and AROM Epidural upon maternal request PCN for GBS positive FSR/ vtx/ 6#  Kristin [name] Kristin [name] Kristin [name]

## 2014-06-29 NOTE — Progress Notes (Signed)
Comfortable w epidural  BP 97/42 mmHg  Pulse 105  Temp(Src) 97.5 F (36.4 C) (Oral)  Ht 5\' 5"  (1.651 m)  Wt 122.471 kg (270 lb)  BMI 44.93 kg/m2  SpO2 100% SVE: 5/60/-2, AROM clear fluid  Toco: q5-6 min EFM: 120s, mod var, + scalp stim  Lab Results  Component Value Date   ALT 122* 06/29/2014   AST 105* 06/29/2014   ALKPHOS 170* 06/29/2014   BILITOT 0.3 06/29/2014    A&P: IOL for IHCP at 37wk LFTs are much decreased since last check.  Cont IOL w pitocin FSR Anticipate SVD

## 2014-06-29 NOTE — Anesthesia Procedure Notes (Signed)
Epidural Patient location during procedure: OB Start time: 06/29/2014 10:48 AM  Staffing Anesthesiologist: Karie SchwalbeJUDD, Hien Perreira Performed by: anesthesiologist   Preanesthetic Checklist Completed: patient identified, site marked, surgical consent, pre-op evaluation, timeout performed, IV checked, risks and benefits discussed and monitors and equipment checked  Epidural Patient position: sitting Prep: site prepped and draped and DuraPrep Patient monitoring: continuous pulse ox and blood pressure Approach: midline Location: L3-L4 Injection technique: LOR saline  Needle:  Needle type: Tuohy  Needle gauge: 17 G Needle length: 9 cm and 9 Needle insertion depth: 8 cm Catheter type: closed end flexible Catheter size: 19 Gauge Catheter at skin depth: 14 cm Test dose: negative  Assessment Events: blood not aspirated, injection not painful, no injection resistance, negative IV test and no paresthesia  Additional Notes Patient identified. Risks/Benefits/Options discussed with patient including but not limited to bleeding, infection, nerve damage, paralysis, failed block, incomplete pain control, headache, blood pressure changes, nausea, vomiting, reactions to medication both or allergic, itching and postpartum back pain. Confirmed with bedside nurse the patient's most recent platelet count. Confirmed with patient that they are not currently taking any anticoagulation, have any bleeding history or any family history of bleeding disorders. Patient expressed understanding and wished to proceed. All questions were answered. Sterile technique was used throughout the entire procedure. Please see nursing notes for vital signs. Test dose was given through epidural catheter and negative prior to continuing to dose epidural or start infusion. Warning signs of high block given to the patient including shortness of breath, tingling/numbness in hands, complete motor block, or any concerning symptoms with instructions  to call for help. Patient was given instructions on fall risk and not to get out of bed. All questions and concerns addressed with instructions to call with any issues or inadequate analgesia.

## 2014-06-30 LAB — COMPREHENSIVE METABOLIC PANEL
ALT: 99 U/L — ABNORMAL HIGH (ref 0–35)
ANION GAP: 6 (ref 5–15)
AST: 76 U/L — AB (ref 0–37)
Albumin: 2.1 g/dL — ABNORMAL LOW (ref 3.5–5.2)
Alkaline Phosphatase: 147 U/L — ABNORMAL HIGH (ref 39–117)
BILIRUBIN TOTAL: 0.2 mg/dL — AB (ref 0.3–1.2)
BUN: 6 mg/dL (ref 6–23)
CHLORIDE: 107 mmol/L (ref 96–112)
CO2: 24 mmol/L (ref 19–32)
Calcium: 8.7 mg/dL (ref 8.4–10.5)
Creatinine, Ser: 0.62 mg/dL (ref 0.50–1.10)
GFR calc Af Amer: >90 mL/min (ref >90)
Glucose, Bld: 89 mg/dL (ref 70–99)
POTASSIUM: 3.8 mmol/L (ref 3.5–5.1)
Sodium: 137 mmol/L (ref 135–145)
Total Protein: 5.4 g/dL — ABNORMAL LOW (ref 6.0–8.3)

## 2014-06-30 LAB — CBC
HCT: 30.7 % — ABNORMAL LOW (ref 36.0–46.0)
Hemoglobin: 10.3 g/dL — ABNORMAL LOW (ref 12.0–15.0)
MCH: 29.9 pg (ref 26.0–34.0)
MCHC: 33.6 g/dL (ref 30.0–36.0)
MCV: 89.2 fL (ref 78.0–100.0)
Platelets: 215 10*3/uL (ref 150–400)
RBC: 3.44 MIL/uL — ABNORMAL LOW (ref 3.87–5.11)
RDW: 15.2 % (ref 11.5–15.5)
WBC: 7.8 10*3/uL (ref 4.0–10.5)

## 2014-06-30 NOTE — Lactation Note (Signed)
This note was copied from the chart of Kristin [name] Coate. Lactation Consultation Note Mom called for assist with latch. She states baby did well last night but has been having some trouble with latch today. Mom has large breasts and large nipples. Assisted mom with getting baby latched. He latched and nursed well for 20 min. Mom pulling breast away from baby's nose so he can breathe. Encouraged not to pull too hard as nipples will get sore. Mom having trouble seeing baby's nose. Used football position which seemed to work well for her. Mom complaining of cramping while nursing  No questions at present. To call for assist prn  Patient Name: Kristin [name] Salvo ZOXWR'UToday's Date: 06/30/2014 Reason for consult: Follow-up assessment;Late preterm infant   Maternal Data Formula Feeding for Exclusion: No Has patient been taught Hand Expression?: Yes Does the patient have breastfeeding experience prior to this delivery?: Yes  Feeding Feeding Type: Breast Fed Nipple Type: Slow - flow Length of feed: 20 min  LATCH Score/Interventions Latch: Grasps breast easily, tongue down, lips flanged, rhythmical sucking. Intervention(s): Skin to skin;Waking techniques Intervention(s): Assist with latch;Breast compression  Audible Swallowing: A few with stimulation Intervention(s): Skin to skin;Hand expression Intervention(s): Skin to skin;Hand expression  Type of Nipple: Everted at rest and after stimulation (large nipples)  Comfort (Breast/Nipple): Soft / non-tender     Hold (Positioning): Assistance needed to correctly position infant at breast and maintain latch. Intervention(s): Breastfeeding basics reviewed;Support Pillows;Skin to skin  LATCH Score: 8  Lactation Tools Discussed/Used Tools: Pump (showed mother hand expression technique) Breast pump type: Double-Electric Breast Pump (mother pumped for 15 min but had no results)   Consult Status Consult Status: Follow-up Date: 07/01/14 Follow-up type:  In-patient    Pamelia HoitWeeks, Marquasha Brutus D 06/30/2014, 12:07 PM

## 2014-06-30 NOTE — Progress Notes (Signed)
Patient is eating, ambulating, voiding.  Pain control is good.  Appropriate lochia.  Breastfeeding going well.  No complaints.  Filed Vitals:   06/29/14 1855 06/29/14 2000 06/29/14 2357 06/30/14 0549  BP: 95/56 116/65 102/87 119/66  Pulse: 72 73 73 78  Temp: 98 F (36.7 C) 98.2 F (36.8 C) 98.2 F (36.8 C) 98 F (36.7 C)  TempSrc: Oral Oral Oral Oral  Resp: 18 18 16 18   Height:      Weight:      SpO2:   100% 99%    Fundus firm Perineum without swelling. No CT  Lab Results  Component Value Date   WBC 7.8 06/30/2014   HGB 10.3* 06/30/2014   HCT 30.7* 06/30/2014   MCV 89.2 06/30/2014   PLT 215 06/30/2014    --/--/O POS (03/28 0810)  A/P Post partum day 1.  Routine care.  Expect d/c 3/30. Peds requested delay circ until tomorrow d/t low birthweight and feeding.    Philip AspenALLAHAN, Patrena Santalucia

## 2014-06-30 NOTE — Anesthesia Postprocedure Evaluation (Signed)
Anesthesia Post Note  Patient: Kristin [name]  Procedure(s) Performed: * No procedures listed *  Anesthesia type: Epidural  Patient location: Mother/Baby  Post pain: Pain level controlled  Post assessment: Post-op Vital signs reviewed  Last Vitals:  Filed Vitals:   06/30/14 0549  BP: 119/66  Pulse: 78  Temp: 36.7 C  Resp: 18    Post vital signs: Reviewed  Level of consciousness:alert  Complications: No apparent anesthesia complications

## 2014-07-01 LAB — COMPREHENSIVE METABOLIC PANEL
ALT: 74 U/L — ABNORMAL HIGH (ref 0–35)
ANION GAP: 6 (ref 5–15)
AST: 48 U/L — ABNORMAL HIGH (ref 0–37)
Albumin: 2.2 g/dL — ABNORMAL LOW (ref 3.5–5.2)
Alkaline Phosphatase: 123 U/L — ABNORMAL HIGH (ref 39–117)
BUN: 8 mg/dL (ref 6–23)
CO2: 24 mmol/L (ref 19–32)
CREATININE: 0.62 mg/dL (ref 0.50–1.10)
Calcium: 8.1 mg/dL — ABNORMAL LOW (ref 8.4–10.5)
Chloride: 107 mmol/L (ref 96–112)
GFR calc Af Amer: >90 mL/min (ref >90)
GFR calc non Af Amer: >90 mL/min (ref >90)
Glucose, Bld: 86 mg/dL (ref 70–99)
Potassium: 3.4 mmol/L — ABNORMAL LOW (ref 3.5–5.1)
Sodium: 137 mmol/L (ref 135–145)
TOTAL PROTEIN: 5.6 g/dL — AB (ref 6.0–8.3)

## 2014-07-01 NOTE — Progress Notes (Signed)
Patient is eating, ambulating, voiding.  Pain control is good.  Filed Vitals:   06/29/14 2357 06/30/14 0549 06/30/14 1811 07/01/14 0612  BP: 102/87 119/66 135/80 113/52  Pulse: 73 78 82 81  Temp: 98.2 F (36.8 C) 98 F (36.7 C) 98.1 F (36.7 C) 98.2 F (36.8 C)  TempSrc: Oral Oral Oral Oral  Resp: 16 18 18 18   Height:      Weight:      SpO2: 100% 99%      Fundus firm Perineum without swelling.  Lab Results  Component Value Date   WBC 7.8 06/30/2014   HGB 10.3* 06/30/2014   HCT 30.7* 06/30/2014   MCV 89.2 06/30/2014   PLT 215 06/30/2014    --/--/O POS (03/28 0810)/RI  A/P Post partum day 2.  Routine care.  Expect d/c today.    Brendy Ficek A

## 2014-07-01 NOTE — Discharge Summary (Signed)
Obstetric Discharge Summary Reason for Admission: induction of labor Prenatal Procedures: NST, ultrasound Intrapartum Procedures: spontaneous vaginal delivery Postpartum Procedures: none Complications-Operative and Postpartum: labial laceration HEMOGLOBIN  Date Value Ref Range Status  06/30/2014 10.3* 12.0 - 15.0 g/dL Final   HCT  Date Value Ref Range Status  06/30/2014 30.7* 36.0 - 46.0 % Final    Discharge Diagnoses: Term Pregnancy-delivered and cholestasis of pregnancy  Discharge Information: Date: 07/01/2014 Activity: pelvic rest Diet: routine Medications: Ibuprofen Condition: stable Instructions: refer to practice specific booklet Discharge to: home Follow-up Information    [name] [name] Spore[facility]ARK[facility] In 4 weeks.   Specialty:  Obstetrics   Contact information:   [address] [place] [id number] [phone]       Newborn Data: Live born female  Birth Weight: 6 lb 1.4 oz (2760 g) APGAR: 9, 9  Home with mother.  Kristin [name] A 07/01/2014, 7:34 AM

## 2014-07-01 NOTE — Progress Notes (Signed)
Parents desires circumsision.  All risks, benefits and alternatives discussed with the mother. 

## 2014-07-01 NOTE — Lactation Note (Signed)
This note was copied from the chart of [name]. Lactation Consultation Note  Patient Name: [name]: 07/01/2014   Visited with Mom on day of discharge, baby 5742 hrs old, and just got circumcised.  Baby at 7% weight loss. Mom asking lots of questions.  She has 3 double electric pumps at home, as she pumped quite a bit with first 2 babies (2 yrs and 6713 months old).  Mom complaining about pain on left nipple, no skin breakdown noted.  Baby primarily breast feeds on right breast, which is smaller and easier for baby to latch.  She continues to pump after breast feeding for 15-20 minutes, not obtaining any milk yet.  Baby receiving Alimentum formula by bottle.  Volume parameters given to Mom.  Encouraged her to make an OP appointment for a week.  Offered to make appointment, but Mom needs to wait until she gets home.  Also told that one of her babies had a tight frenulum repair, and she was worried about this baby.  Frenulum felt under tongue, but baby did extend his tongue out of mouth, and lift it up.  I recommended she make an OP appointment and LC would look at it again then.  Encouraged skin to skin, feeding on cue, waking at 3 hrs due to small size.  Mom reminded about engorgement treatment, and OP lactation services available.  To call prn.    Judee ClaraSmith, Adaliz Dobis E 07/01/2014, 9:35 AM

## 2015-09-18 ENCOUNTER — Encounter (HOSPITAL_COMMUNITY): Payer: Self-pay | Admitting: *Deleted

## 2015-09-18 ENCOUNTER — Inpatient Hospital Stay (HOSPITAL_COMMUNITY)
Admission: AD | Admit: 2015-09-18 | Discharge: 2015-09-18 | Disposition: A | Discharge status: Home / Self Care | Payer: Self-pay | Source: Ambulatory Visit | Attending: Obstetrics and Gynecology | Admitting: Obstetrics and Gynecology

## 2015-09-18 ENCOUNTER — Inpatient Hospital Stay (HOSPITAL_COMMUNITY): Payer: Medicaid Other

## 2015-09-18 DIAGNOSIS — O209 Hemorrhage in early pregnancy, unspecified: Secondary | ICD-10-CM

## 2015-09-18 DIAGNOSIS — O039 Complete or unspecified spontaneous abortion without complication: Secondary | ICD-10-CM | POA: Insufficient documentation

## 2015-09-18 HISTORY — DX: Postpartum depression: F53.0

## 2015-09-18 HISTORY — DX: Other mental disorders complicating the puerperium: O99.345

## 2015-09-18 LAB — URINE MICROSCOPIC-ADD ON

## 2015-09-18 LAB — URINALYSIS, ROUTINE W REFLEX MICROSCOPIC
Bilirubin Urine: NEGATIVE
GLUCOSE, UA: NEGATIVE mg/dL
Ketones, ur: NEGATIVE mg/dL
Nitrite: POSITIVE — AB
PH: 6 (ref 5.0–8.0)
Protein, ur: NEGATIVE mg/dL
Specific Gravity, Urine: 1.02 (ref 1.005–1.030)

## 2015-09-18 LAB — POCT PREGNANCY, URINE: Preg Test, Ur: POSITIVE — AB

## 2015-09-18 LAB — CBC
HCT: 32.1 % — ABNORMAL LOW (ref 36.0–46.0)
HEMOGLOBIN: 10.5 g/dL — AB (ref 12.0–15.0)
MCH: 27.8 pg (ref 26.0–34.0)
MCHC: 32.7 g/dL (ref 30.0–36.0)
MCV: 84.9 fL (ref 78.0–100.0)
Platelets: 330 10*3/uL (ref 150–400)
RBC: 3.78 MIL/uL — AB (ref 3.87–5.11)
RDW: 16.2 % — ABNORMAL HIGH (ref 11.5–15.5)
WBC: 7.3 10*3/uL (ref 4.0–10.5)

## 2015-09-18 LAB — HCG, QUANTITATIVE, PREGNANCY: hCG, Beta Chain, Quant, S: 6959 m[IU]/mL — ABNORMAL HIGH (ref <5)

## 2015-09-18 LAB — WET PREP, GENITAL
Sperm: NONE SEEN
Trich, Wet Prep: NONE SEEN
Yeast Wet Prep HPF POC: NONE SEEN

## 2015-09-18 NOTE — MAU Provider Note (Signed)
History     CSN: [id number]  Arrival date and time: 09/18/15 1045   First Provider Initiated Contact with Patient 09/18/15 1148      Chief Complaint  Patient presents with  . Vaginal Bleeding   HPI Kristin [name] 25 y.o. [redacted]w[redacted]d  Client is visiting from [place].  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  She sees a doctor in [place] for prenatal care.  Has had an ultrasound that showed embryo with FHT at 5 weeks.  Yesterday had some bleeding similar to menstrual bleeding.  Has had some bleeding today but has not worn a pad.  Has 3 children and yesterday had an attempted abduction of one of her [place] the park.  The police were involved so she has had tremendous stress in the last 2 days.  Worried that the stress caused her vaginal bleeding.  OB History    Gravida Para Term Preterm AB TAB SAB Ectopic Multiple Living   4 3 3  0 0 0 0 0 0 3      Past Medical History  Diagnosis Date  . Cholestasis of pregnancy in third trimester   . Post partum depression     Past Surgical History  Procedure Laterality Date  . No past surgeries      Family History  Problem Relation Age of Onset  . Other Neg Hx   . Alcohol abuse Neg Hx   . Asthma Neg Hx   . Arthritis Neg Hx   . Birth defects Neg Hx   . Cancer Neg Hx   . COPD Neg Hx   . Depression Neg Hx   . Diabetes Neg Hx   . Drug abuse Neg Hx   . Early death Neg Hx   . Hearing loss Neg Hx   . Heart disease Neg Hx   . Hyperlipidemia Neg Hx   . Kidney disease Neg Hx   . Learning disabilities Neg Hx   . Mental illness Neg Hx   . Mental retardation Neg Hx   . Miscarriages / Stillbirths Neg Hx   . Stroke Neg Hx   . Vision loss Neg Hx   . Varicose Veins Neg Hx   . Hypertension Mother     Social History  Substance Use Topics  . Smoking status: Never Smoker   . Smokeless tobacco: Never Used  . Alcohol Use: No     Comment: occasional ETOH use prior to pregnancy    Allergies: No Known Allergies  Prescriptions prior to admission   Medication Sig Dispense Refill Last Dose  . sertraline (ZOLOFT) 25 MG tablet Take 75 mg by mouth daily.   09/17/2015 at Unknown time  . fluconazole (DIFLUCAN) 150 MG tablet Take 1 now and repeat in 2 days if needed (Patient not taking: Reported on 06/29/2014) 2 tablet 0     Review of Systems  Constitutional: Negative for fever.  Gastrointestinal: Negative for nausea, vomiting and abdominal pain.  Genitourinary:       No vaginal discharge. Vaginal bleeding. No dysuria.   Physical Exam   Blood pressure 116/85, pulse 82, temperature 98.2 F (36.8 C), temperature source Oral, resp. rate 20, height 5\' 5"  (1.651 m), weight 280 lb 9.6 oz (127.279 kg), not currently breastfeeding.  Physical Exam  Nursing note and vitals reviewed. Constitutional: She is oriented to person, place, and time. She appears well-developed and well-nourished.  HENT:  Head: Normocephalic.  Eyes: EOM are normal.  Neck: Neck supple.  GI: Soft. There is no  tenderness.  Genitourinary:  Speculum exam: Vagina - Small amount of bloody discharge, no odor Cervix - shiny mucus in cervical canal, when speculum removed, small fluid filled sac came to the introitus, sac placed in specimen container and sent to pathology. Bimanual exam: Cervix - external os 1 cm Uterus non tender, evaluation of size limited by habitus Adnexa non tender,  GC/Chlam, wet prep done Chaperone present for exam.  Musculoskeletal: Normal range of motion.  Neurological: She is alert and oriented to person, place, and time.  Skin: Skin is warm and dry.  Psychiatric: She has a normal mood and affect.    MAU Course  Procedures Results for orders placed or performed during the hospital encounter of 09/18/15 (from the past 24 hour(s))  Urinalysis, Routine w reflex microscopic (not at [facility])     Status: Abnormal   Collection Time: 09/18/15 10:45 AM  Result Value Ref Range   Color, Urine YELLOW YELLOW   APPearance CLEAR CLEAR   Specific Gravity,  Urine 1.020 1.005 - 1.030   pH 6.0 5.0 - 8.0   Glucose, UA NEGATIVE NEGATIVE mg/dL   Hgb urine dipstick MODERATE (A) NEGATIVE   Bilirubin Urine NEGATIVE NEGATIVE   Ketones, ur NEGATIVE NEGATIVE mg/dL   Protein, ur NEGATIVE NEGATIVE mg/dL   Nitrite POSITIVE (A) NEGATIVE   Leukocytes, UA SMALL (A) NEGATIVE  Urine microscopic-add on     Status: Abnormal   Collection Time: 09/18/15 10:45 AM  Result Value Ref Range   Squamous Epithelial / LPF 0-5 (A) NONE SEEN   WBC, UA 0-5 0 - 5 WBC/hpf   RBC / HPF 6-30 0 - 5 RBC/hpf   Bacteria, UA MANY (A) NONE SEEN  Pregnancy, urine POC     Status: Abnormal   Collection Time: 09/18/15 11:16 AM  Result Value Ref Range   Preg Test, Ur POSITIVE (A) NEGATIVE  Wet prep, genital     Status: Abnormal   Collection Time: 09/18/15 12:00 PM  Result Value Ref Range   Yeast Wet Prep HPF POC NONE SEEN NONE SEEN   Trich, Wet Prep NONE SEEN NONE SEEN   Clue Cells Wet Prep HPF POC PRESENT (A) NONE SEEN   WBC, Wet Prep HPF POC FEW (A) NONE SEEN   Sperm NONE SEEN   CBC     Status: Abnormal   Collection Time: 09/18/15 12:01 PM  Result Value Ref Range   WBC 7.3 4.0 - 10.5 K/uL   RBC 3.78 (L) 3.87 - 5.11 MIL/uL   Hemoglobin 10.5 (L) 12.0 - 15.0 g/dL   HCT 16.1 (L) 09.6 - 04.5 %   MCV 84.9 78.0 - 100.0 fL   MCH 27.8 26.0 - 34.0 pg   MCHC 32.7 30.0 - 36.0 g/dL   RDW 40.9 (H) 81.1 - 91.4 %   Platelets 330 150 - 400 K/uL  hCG, quantitative, pregnancy     Status: Abnormal   Collection Time: 09/18/15 12:01 PM  Result Value Ref Range   hCG, Beta Chain, Quant, S 6959 (H) <5 mIU/mL    MDM CLINICAL DATA: Vaginal bleeding, first trimester of pregnancy.  EXAM: OBSTETRIC <14 WK [place] AND TRANSVAGINAL OB US  TECHNIQUE: Both transabdominal and transvaginal ultrasound examinations were performed for complete evaluation of the gestation as well as the maternal uterus, adnexal regions, and pelvic cul-de-sac. Transvaginal technique was performed to assess early  pregnancy.  COMPARISON: None.  FINDINGS: Intrauterine gestational sac: Not visualized.  Yolk sac: Not visualized.  Embryo: Not visualized.  Cardiac  Activity: Not visualized.  Maternal uterus/adnexae: Ovaries appear normal. Trace free fluid is noted which most likely is physiologic.  IMPRESSION: No intrauterine fluid collection or gestational is noted. In a pregnant patient, the differential diagnosis includes early intrauterine pregnancy, nonvisualized ectopic pregnancy or missed abortion. Correlation with beta HCG levels and follow-up ultrasound in 10-14 days is recommended for further evaluation.  Assessment and Plan  Miscarriage most likely - pathology will confirm  Plan [place].  Can get records from here and confirm the miscarriage. No sex until you see your doctor.  Condoms always for contraception. The stress from yesterday did not cause your miscarriage as the size of the sac is not consistent with a 9 week pregnancy.  BURLESON,TERRI 09/18/2015, 1:40 PM

## 2015-09-18 NOTE — Discharge Instructions (Signed)
Follow up with your medical provider in [place]. No unprotected sex until you have seen your doctor.

## 2015-09-18 NOTE — MAU Note (Signed)
Daughter ws almost abducted from park yest[place], was doing a lot of running around with police.  Bleeding started last night and has progressively increased.  Mild cramping noted as well

## 2015-09-20 LAB — URINE CULTURE: SPECIAL REQUESTS: NORMAL

## 2015-09-20 LAB — GC/CHLAMYDIA PROBE AMP (~~LOC~~) NOT AT ARMC
Chlamydia: NEGATIVE
Neisseria Gonorrhea: NEGATIVE

## 2015-09-21 ENCOUNTER — Telehealth: Payer: Self-pay | Admitting: Student

## 2015-09-21 DIAGNOSIS — N390 Urinary tract infection, site not specified: Secondary | ICD-10-CM

## 2015-09-21 MED ORDER — SULFAMETHOXAZOLE-TRIMETHOPRIM 800-160 MG PO TABS: 1.0000 | ORAL_TABLET | Freq: Two times a day (BID) | ORAL | Status: AC

## 2015-09-21 NOTE — Telephone Encounter (Signed)
Calling pt to discuss urine culture results.  No answer, left message

## 2015-09-21 NOTE — Telephone Encounter (Signed)
Pt returned phone call. Discussed urine culture results. Verified NKDA & pharmacy. Discussed reasons to follow up for UTI. Answered questions regarding miscarriage follow up.   Judeth HornErin Avalin Briley, NP

## 2015-09-24 LAB — HIV ANTIBODY (ROUTINE TESTING W REFLEX): HIV SCREEN 4TH GENERATION: NONREACTIVE

## 2015-09-24 LAB — RPR: RPR: NONREACTIVE

## 2016-07-23 ENCOUNTER — Encounter (HOSPITAL_COMMUNITY): Payer: Self-pay

## 2020-08-04 ENCOUNTER — Emergency Department (HOSPITAL_COMMUNITY)
Admission: EM | Admit: 2020-08-04 | Discharge: 2020-08-04 | Disposition: A | Discharge status: Home / Self Care | Payer: Medicaid - Out of State | Attending: Emergency Medicine | Admitting: Emergency Medicine

## 2020-08-04 ENCOUNTER — Encounter (HOSPITAL_COMMUNITY): Payer: Self-pay

## 2020-08-04 ENCOUNTER — Other Ambulatory Visit: Payer: Self-pay

## 2020-08-04 ENCOUNTER — Inpatient Hospital Stay (HOSPITAL_COMMUNITY): Payer: Medicaid - Out of State

## 2020-08-04 ENCOUNTER — Inpatient Hospital Stay (EMERGENCY_DEPARTMENT_HOSPITAL)
Admission: AD | Admit: 2020-08-04 | Discharge: 2020-08-05 | Discharge status: Against Medical Advice | Payer: Medicaid - Out of State | Source: Home / Self Care | Attending: Obstetrics & Gynecology | Admitting: Obstetrics & Gynecology

## 2020-08-04 ENCOUNTER — Encounter (HOSPITAL_COMMUNITY): Payer: Self-pay | Admitting: Obstetrics & Gynecology

## 2020-08-04 DIAGNOSIS — R1084 Generalized abdominal pain: Secondary | ICD-10-CM | POA: Insufficient documentation

## 2020-08-04 DIAGNOSIS — K8020 Calculus of gallbladder without cholecystitis without obstruction: Secondary | ICD-10-CM | POA: Insufficient documentation

## 2020-08-04 DIAGNOSIS — R7401 Elevation of levels of liver transaminase levels: Secondary | ICD-10-CM | POA: Diagnosis present

## 2020-08-04 DIAGNOSIS — O26612 Liver and biliary tract disorders in pregnancy, second trimester: Secondary | ICD-10-CM | POA: Insufficient documentation

## 2020-08-04 DIAGNOSIS — O0932 Supervision of pregnancy with insufficient antenatal care, second trimester: Secondary | ICD-10-CM | POA: Insufficient documentation

## 2020-08-04 DIAGNOSIS — O3680X0 Pregnancy with inconclusive fetal viability, not applicable or unspecified: Secondary | ICD-10-CM

## 2020-08-04 DIAGNOSIS — R102 Pelvic and perineal pain: Secondary | ICD-10-CM

## 2020-08-04 DIAGNOSIS — Z3A22 22 weeks gestation of pregnancy: Secondary | ICD-10-CM | POA: Insufficient documentation

## 2020-08-04 DIAGNOSIS — Z5321 Procedure and treatment not carried out due to patient leaving prior to being seen by health care provider: Secondary | ICD-10-CM | POA: Insufficient documentation

## 2020-08-04 DIAGNOSIS — O26891 Other specified pregnancy related conditions, first trimester: Secondary | ICD-10-CM

## 2020-08-04 LAB — COMPREHENSIVE METABOLIC PANEL
ALT: 180 U/L — ABNORMAL HIGH (ref 0–44)
AST: 141 U/L — ABNORMAL HIGH (ref 15–41)
Albumin: 2.4 g/dL — ABNORMAL LOW (ref 3.5–5.0)
Alkaline Phosphatase: 101 U/L (ref 38–126)
Anion gap: 8 (ref 5–15)
BUN: 8 mg/dL (ref 6–20)
CO2: 23 mmol/L (ref 22–32)
Calcium: 9.3 mg/dL (ref 8.9–10.3)
Chloride: 104 mmol/L (ref 98–111)
Creatinine, Ser: 0.73 mg/dL (ref 0.44–1.00)
GFR, Estimated: >60 mL/min (ref >60)
Glucose, Bld: 86 mg/dL (ref 70–99)
Potassium: 3.7 mmol/L (ref 3.5–5.1)
Sodium: 135 mmol/L (ref 135–145)
Total Bilirubin: 0.4 mg/dL (ref 0.3–1.2)
Total Protein: 6.9 g/dL (ref 6.5–8.1)

## 2020-08-04 LAB — CBC WITH DIFFERENTIAL/PLATELET
Abs Immature Granulocytes: 0.02 10*3/uL (ref 0.00–0.07)
Basophils Absolute: 0 10*3/uL (ref 0.0–0.1)
Basophils Relative: 0 %
Eosinophils Absolute: 0.1 10*3/uL (ref 0.0–0.5)
Eosinophils Relative: 1 %
HCT: 31.8 % — ABNORMAL LOW (ref 36.0–46.0)
Hemoglobin: 10.4 g/dL — ABNORMAL LOW (ref 12.0–15.0)
Immature Granulocytes: 0 %
Lymphocytes Relative: 31 %
Lymphs Abs: 2.3 10*3/uL (ref 0.7–4.0)
MCH: 28.6 pg (ref 26.0–34.0)
MCHC: 32.7 g/dL (ref 30.0–36.0)
MCV: 87.4 fL (ref 80.0–100.0)
Monocytes Absolute: 0.7 10*3/uL (ref 0.1–1.0)
Monocytes Relative: 9 %
Neutro Abs: 4.5 10*3/uL (ref 1.7–7.7)
Neutrophils Relative %: 59 %
Platelets: 376 10*3/uL (ref 150–400)
RBC: 3.64 MIL/uL — ABNORMAL LOW (ref 3.87–5.11)
RDW: 16.2 % — ABNORMAL HIGH (ref 11.5–15.5)
WBC: 7.5 10*3/uL (ref 4.0–10.5)
nRBC: 0 % (ref 0.0–0.2)

## 2020-08-04 LAB — URINALYSIS, ROUTINE W REFLEX MICROSCOPIC
Bilirubin Urine: NEGATIVE
Glucose, UA: NEGATIVE mg/dL
Hgb urine dipstick: NEGATIVE
Ketones, ur: NEGATIVE mg/dL
Nitrite: POSITIVE — AB
Protein, ur: NEGATIVE mg/dL
Specific Gravity, Urine: 1.016 (ref 1.005–1.030)
pH: 6 (ref 5.0–8.0)

## 2020-08-04 LAB — I-STAT BETA HCG BLOOD, ED (MC, WL, AP ONLY): I-stat hCG, quantitative: >2000 m[IU]/mL — ABNORMAL HIGH (ref <5)

## 2020-08-04 LAB — HCG, QUANTITATIVE, PREGNANCY: hCG, Beta Chain, Quant, S: 91966 m[IU]/mL — ABNORMAL HIGH (ref <5)

## 2020-08-04 LAB — LIPASE, BLOOD: Lipase: 28 U/L (ref 11–51)

## 2020-08-04 NOTE — ED Notes (Signed)
Transport called at this time.  

## 2020-08-04 NOTE — MAU Note (Signed)
Pt presented to main ED tonight with abdominal pain. Did not know she was pregnant but had pregnancy confirmed in ED. Sent to MAU for further eval of abdominal pain. Pain present for 2 days. NO VB or d/c

## 2020-08-04 NOTE — ED Provider Notes (Addendum)
Emergency Medicine Provider Triage Evaluation Note  Kristin [name] , a 30 y.o. female  was evaluated in triage.  Pt complains of cramping low abdominal pain. Pain started acutely. Comes and goes. Radiates to the back. Denies history of previous abdominal surgeries. Some vaginal itching, but no discharge.   LMP was in December. Hasn't had a pregnancy test.   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 Her husband is Hotel manager and stationed in [place]. She reports not paying much attention to her physical health the past year.   9:00 PM - spoke with Kristin [name] from MAU and she is okay to see the patient. Transfer to MAU  9:02 PM - Spoke with patient and she is good with the plan.    Review of Systems  Positive: LLQ and RLQ pain. Back pain Negative: Nausea, vomiting, fevers, chills, dysuria, hematuria, or vaginal discharge  Physical Exam  There were no vitals taken for this visit. Gen:   Awake, no distress  Resp:  Normal effort MSK:   Moves extremities without difficulty. No bony tenderness to spine.  Abdomen Diffuse tender to lower quadrant. No CVAT. No rebound. No guarding. No murphy.   Medical Decision Making  Medically screening exam initiated at 7:36 PM.  Appropriate orders placed.  Ceanna Wareing was informed that the remainder of the evaluation will be completed by another provider, this initial triage assessment does not replace that evaluation, and the importance of remaining in the ED until their evaluation is complete.  Lower abdominal pain    Theron Arista, Cordelia Poche 08/04/20 1947    Cathren Laine, MD 08/04/20 2048    Theron Arista, [name]-C 08/04/20 2103    Cathren Laine, MD 08/09/20 [phone]

## 2020-08-04 NOTE — ED Triage Notes (Signed)
Lower right abdominal pain started 3 days ago. Last period was December 2021. Denies any nausea, vomiting, diarrhea and constipation.

## 2020-08-05 DIAGNOSIS — R7401 Elevation of levels of liver transaminase levels: Secondary | ICD-10-CM | POA: Diagnosis present

## 2020-08-05 DIAGNOSIS — Z3A22 22 weeks gestation of pregnancy: Secondary | ICD-10-CM

## 2020-08-05 DIAGNOSIS — O0932 Supervision of pregnancy with insufficient antenatal care, second trimester: Secondary | ICD-10-CM

## 2020-08-05 DIAGNOSIS — K8050 Calculus of bile duct without cholangitis or cholecystitis without obstruction: Secondary | ICD-10-CM

## 2020-08-05 DIAGNOSIS — O26612 Liver and biliary tract disorders in pregnancy, second trimester: Secondary | ICD-10-CM

## 2020-08-05 DIAGNOSIS — R1010 Upper abdominal pain, unspecified: Secondary | ICD-10-CM

## 2020-08-05 DIAGNOSIS — O99891 Other specified diseases and conditions complicating pregnancy: Secondary | ICD-10-CM

## 2020-08-05 NOTE — MAU Note (Signed)
Admission person states pt left the hospital.

## 2020-08-05 NOTE — MAU Note (Signed)
Wynelle Bourgeois CNM talked with pt. Will try to get pt in a room as soon as possible

## 2020-08-05 NOTE — MAU Provider Note (Signed)
Chief Complaint:  Abdominal Pain  >>Patient seen for medical screening exam in triage, as acuity of unit will necessitate additional time before we can get her into a room  I had already ordered an [place] in waiting room because she was unknown gestation.  US showed she is 22 wks    I spoke with her regarding complaints/ROS and told her we would get her in a room ASAP.    She subsequently left AMA without signing out.    Event Date/Time   First Provider Initiated Contact with Patient 08/05/20 0014     HPI: Karynn Deblasi is a 30 y.o. [id number] at [facility] presents to maternity admissions reporting Pain near epigastric and upper abdomen.  She denies pain in lower abdomen.  . She reports not feeling fetal movement, denies LOF, vaginal bleeding, vaginal itching/burning, urinary symptoms, h/a, dizziness, n/v, diarrhea, constipation or fever/chills.   Abdominal Pain This is a new problem. The current episode started in the past 7 days. The onset quality is gradual. The problem occurs intermittently. The problem has been unchanged. The pain is located in the generalized abdominal region and epigastric region. The quality of the pain is cramping and dull. The abdominal pain does not radiate. Pertinent negatives include no constipation, diarrhea, dysuria, fever, frequency, headaches, myalgias, nausea or vomiting. Nothing aggravates the pain. The pain is relieved by nothing. She has tried nothing for the symptoms. Her past medical history is significant for gallstones.    ED Note: Kirstyn Lean , a 30 y.o. female  was evaluated in triage.  Pt complains of cramping low abdominal pain. Pain started acutely. Comes and goes. Radiates to the back. Denies history of previous abdominal surgeries. Some vaginal itching, but no discharge.  LMP was in December. Hasn't had a pregnancy test.  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 Her husband is Hotel manager and stationed in [place]. She reports not paying much  attention to her physical health the past year.    Past Medical History: Past Medical History:  Diagnosis Date  . Cholestasis of pregnancy in third trimester   . Post partum depression     Past obstetric history: OB History  Gravida Para Term Preterm AB Living  5 4 3 1  0 4  SAB IAB Ectopic Multiple Live Births  0 0 0 0 4    # Outcome Date GA Lbr Len/2nd Weight Sex Delivery Anes PTL Lv  5 Preterm 09/21/16    F Vag-Spont   LIV  4 Term 06/29/14 [redacted]w[redacted]d 05:00 / 00:01 2760 g M Vag-Spont EPI  LIV     Birth Comments: None  3 Term 05/26/13 [redacted]w[redacted]d 07:12 / 00:11 2381 g F Vag-Spont EPI  LIV     Birth Comments: none  2 Term 12/19/11 [redacted]w[redacted]d 06:04 / 00:29 2620 g F Vag-Spont EPI  LIV     Birth Comments: iugr and 2 vessel cord  1 Gravida             Past Surgical History: Past Surgical History:  Procedure Laterality Date  . NO PAST SURGERIES      Family History: Family History  Problem Relation Age of Onset  . Other Neg Hx   . Alcohol abuse Neg Hx   . Asthma Neg Hx   . Arthritis Neg Hx   . Birth defects Neg Hx   . Cancer Neg Hx   . COPD Neg Hx   . Depression Neg Hx   . Diabetes Neg Hx   .  Drug abuse Neg Hx   . Early death Neg Hx   . Hearing loss Neg Hx   . Heart disease Neg Hx   . Hyperlipidemia Neg Hx   . Kidney disease Neg Hx   . Learning disabilities Neg Hx   . Mental illness Neg Hx   . Mental retardation Neg Hx   . Miscarriages / Stillbirths Neg Hx   . Stroke Neg Hx   . Vision loss Neg Hx   . Varicose Veins Neg Hx   . Hypertension Mother     Social History: Social History   Tobacco Use  . Smoking status: Never Smoker  . Smokeless tobacco: Never Used  Substance Use Topics  . Alcohol use: No    Alcohol/week: 2.0 standard drinks    Types: 2 Glasses of wine per week    Comment: occasional ETOH use prior to pregnancy  . Drug use: No    Allergies: No Known Allergies  Meds:  No medications prior to admission.    I have reviewed patient's Past Medical Hx,  Surgical Hx, Family Hx, Social Hx, medications and allergies.   ROS:  Review of Systems  Constitutional: Negative for fever.  Gastrointestinal: Positive for abdominal pain. Negative for constipation, diarrhea, nausea and vomiting.  Genitourinary: Negative for dysuria and frequency.  Musculoskeletal: Negative for myalgias.  Neurological: Negative for headaches.   Other systems negative  Physical Exam   Patient Vitals for the past 24 hrs:  BP Temp Pulse Resp  08/04/20 2357 121/74 -- 86 --  08/04/20 2355 -- 98 F (36.7 C) -- 16   Constitutional: Well-developed, well-nourished female in no acute distress.  Cardiovascular: normal rate  Respiratory: normal effort GI: Abd soft, non-tender, gravid appropriate for gestational age.   No rebound or guarding. MS: Extremities nontender, no edema, normal ROM Neurologic: Alert and oriented x 4.  GU: Neg CVAT.  FHT:  162   Labs: Results for orders placed or performed during the hospital encounter of 08/04/20 (from the past 48 hour(s))  hCG, quantitative, pregnancy     Status: Abnormal   Collection Time: 08/04/20  8:11 PM  Result Value Ref Range   hCG, Beta Chain, Quant, S 91,966 (H) <5 mIU/mL    Comment:          GEST. AGE      CONC.  (mIU/mL)   <=1 WEEK        5 - 50     2 WEEKS       50 - 500     3 WEEKS       100 - 10,000     4 WEEKS     1,000 - 30,000     5 WEEKS     3,500 - 115,000   6-8 WEEKS     12,000 - 270,000    12 WEEKS     15,000 - 220,000        FEMALE AND NON-PREGNANT FEMALE:     LESS THAN 5 mIU/mL Performed at [facility], [address] [place], [place] [address]     Ref. Range 08/04/2020 20:11  Sodium Latest Ref Range: 135 - 145 mmol/L 135  Potassium Latest Ref Range: 3.5 - 5.1 mmol/L 3.7  Chloride Latest Ref Range: 98 - 111 mmol/L 104  CO2 Latest Ref Range: 22 - 32 mmol/L 23  Glucose Latest Ref Range: 70 - 99 mg/dL 86  BUN Latest Ref Range: 6 - 20 mg/dL 8  Creatinine Latest  Ref Range: 0.44 - 1.00 mg/dL  1.88  Calcium Latest Ref Range: 8.9 - 10.3 mg/dL 9.3  Anion gap Latest Ref Range: 5 - 15  8  Alkaline Phosphatase Latest Ref Range: 38 - 126 U/L 101  Albumin Latest Ref Range: 3.5 - 5.0 g/dL 2.4 (L)  Lipase Latest Ref Range: 11 - 51 U/L 28  AST Latest Ref Range: 15 - 41 U/L 141 (H)  ALT Latest Ref Range: 0 - 44 U/L 180 (H)  Total Protein Latest Ref Range: 6.5 - 8.1 g/dL 6.9  Total Bilirubin Latest Ref Range: 0.3 - 1.2 mg/dL 0.4  GFR, Estimated Latest Ref Range: >60 mL/min >60    Ref. Range 08/04/2020 20:11  WBC Latest Ref Range: 4.0 - 10.5 K/uL 7.5  RBC Latest Ref Range: 3.87 - 5.11 MIL/uL 3.64 (L)  Hemoglobin Latest Ref Range: 12.0 - 15.0 g/dL 41.6 (L)  HCT Latest Ref Range: 36.0 - 46.0 % 31.8 (L)  MCV Latest Ref Range: 80.0 - 100.0 fL 87.4  MCH Latest Ref Range: 26.0 - 34.0 pg 28.6  MCHC Latest Ref Range: 30.0 - 36.0 g/dL 60.6  RDW Latest Ref Range: 11.5 - 15.5 % 16.2 (H)  Platelets Latest Ref Range: 150 - 400 K/uL 376    Imaging:  Ant placenta Amniotic fluid WNL EGA [redacted]w[redacted]d Anatomy normal Cervix length 3.9cm  MAU Course/MDM: I have ordered labs and reviewed results. Labs normal except for chronically elevated transaminases  (Pt had cholestasis in pregnancy 2016 with elevated transaminases then, never rechecked afterward)  Discussed I still needed to examine her to determine source of pain May consider RUQ [place] to evaluate liver and gallbladder since she has known stones.  Has never had GB pain or vomiting episodes.  Discussed we need to run Hepatitis screens and do further workup States is only her for 3 weeks and has a primary doctor in [facility] I told her to call him in AM to get on schedule for followup  Soon after I saw her she left unannounced AMA.  Assessment: Single IUP at [redacted]w[redacted]d Upper abdominal pain Elevated transaminases History of cholestasis Known cholelithiasis No prenatal care (did not know she was pregnant)  Plan: Left AMA  Wynelle Bourgeois CNM, MSN Certified  Nurse-Midwife 08/05/2020 5:01 AM

## 2021-09-01 IMAGING — US US MFM OB COMP +14 WKS
1 series · 14 of 28 positions shown · non-contrast
Comparison: none

[Series 1: us mfm ob comp +14 wks · 65 acquisitions, 14 frames shown]
[im 3/65]
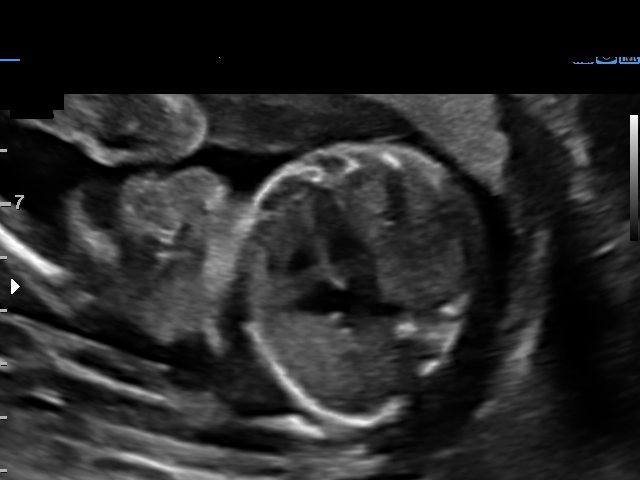
[im 8/65]
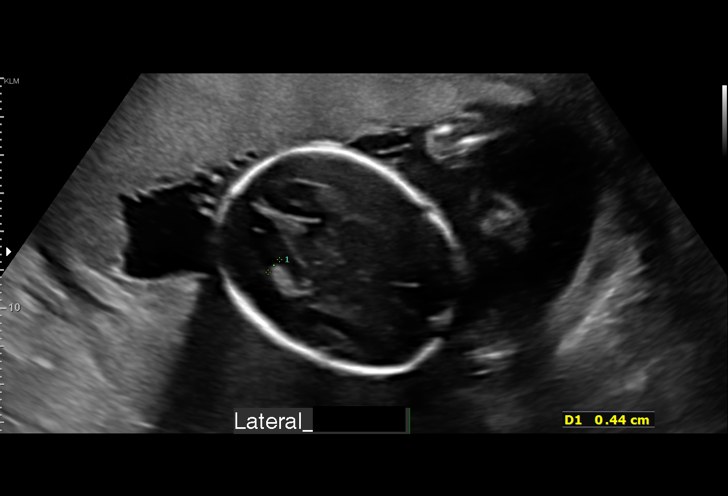
[im 12/65]
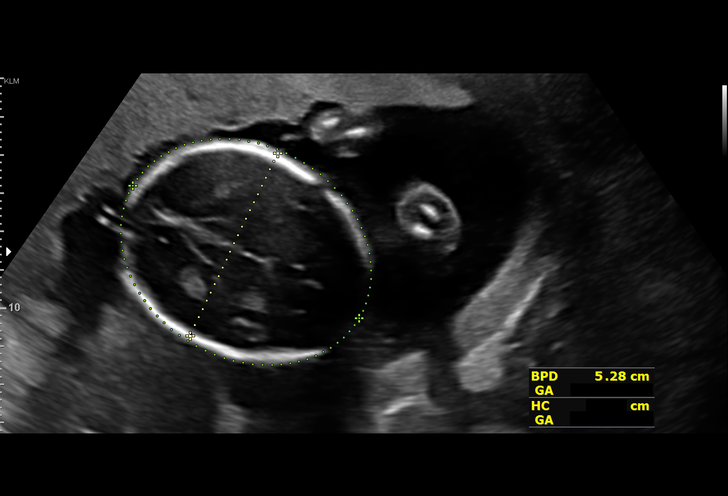
[im 17/65]
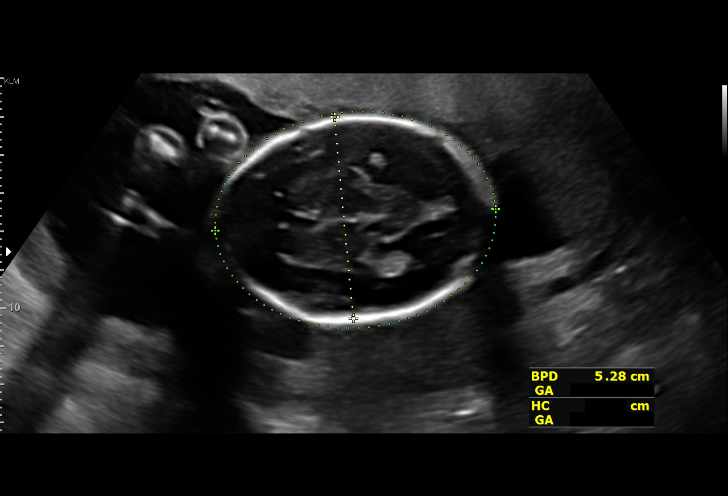
[im 22/65]
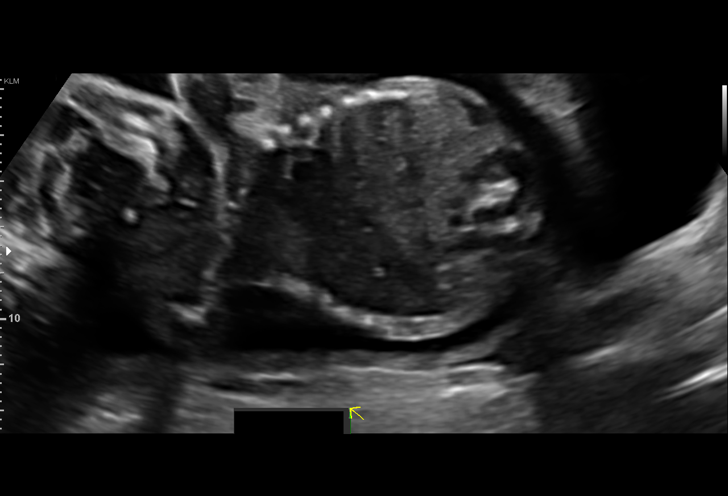
[im 27/65]
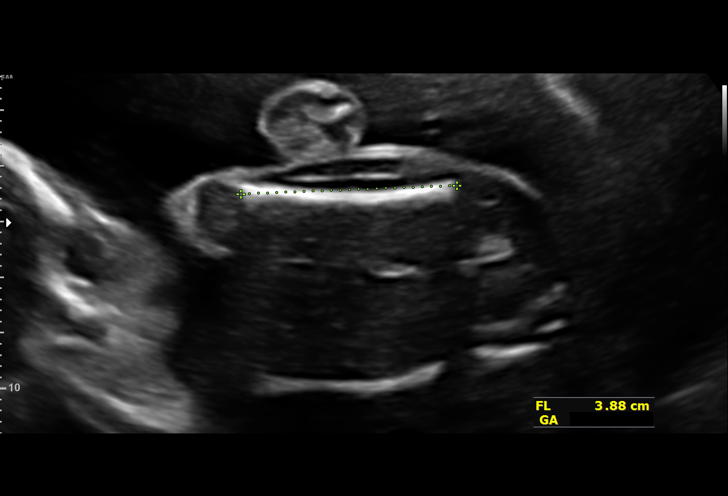
[im 31/65]
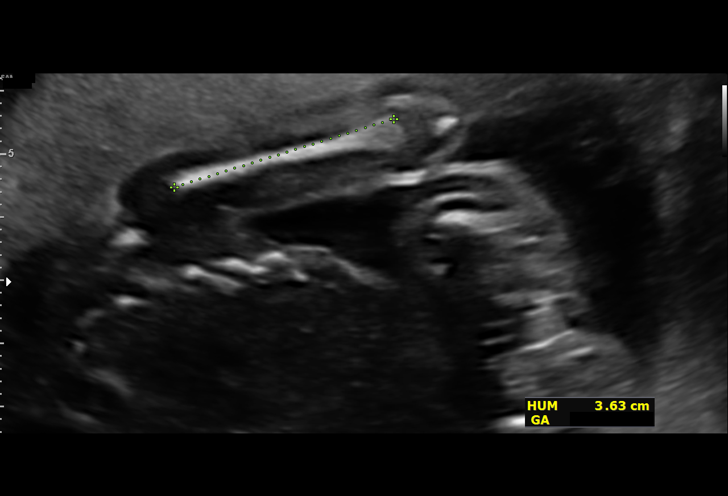
[im 36/65]
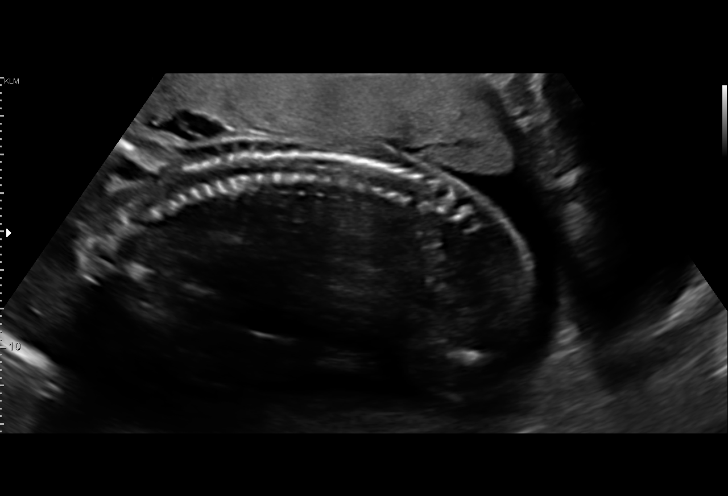
[im 41/65]
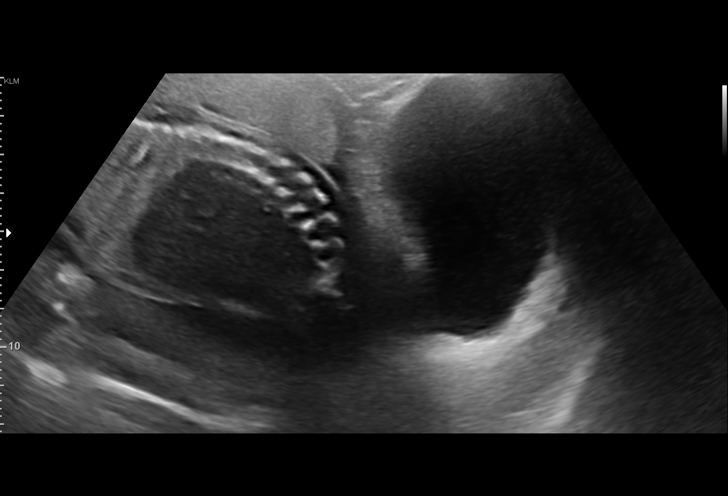
[im 46/65]
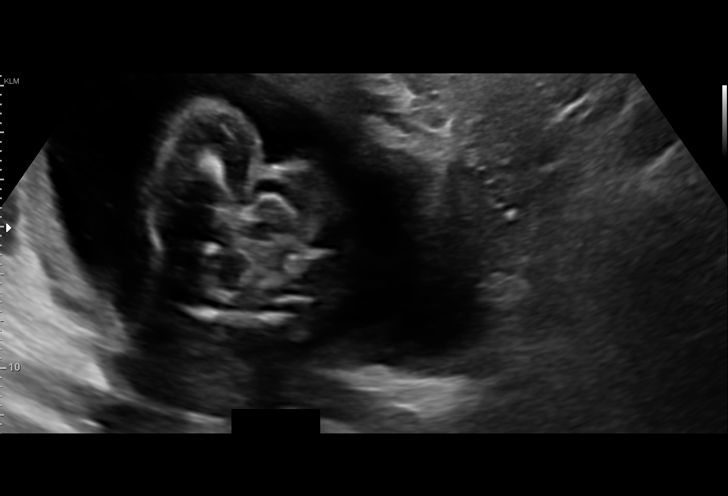
[im 50/65]
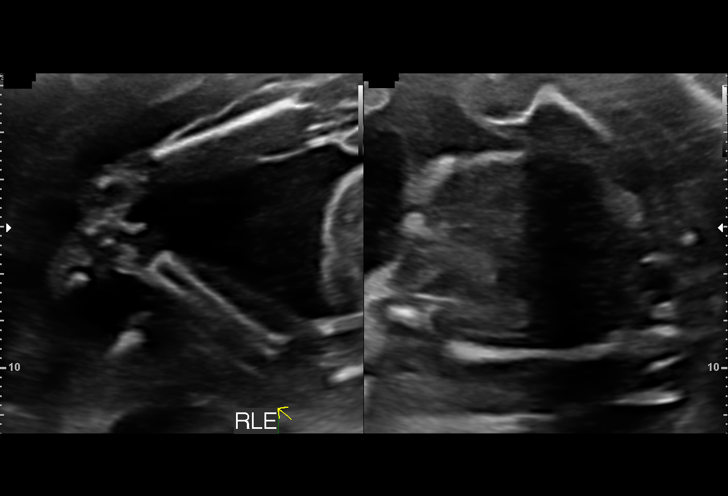
[im 55/65]
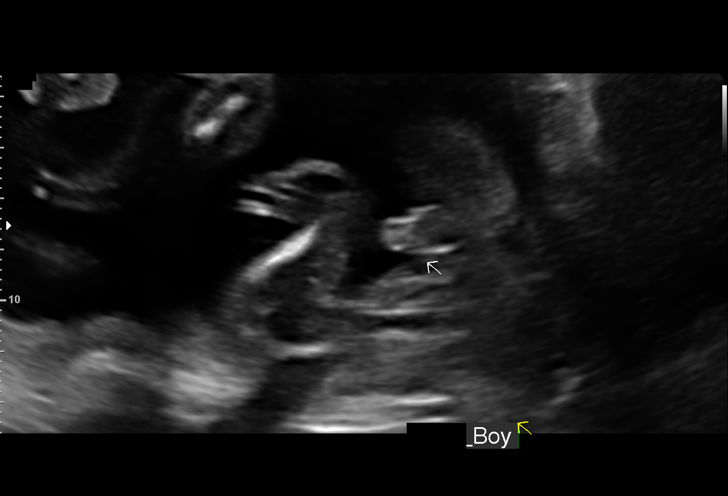
[im 60/65]
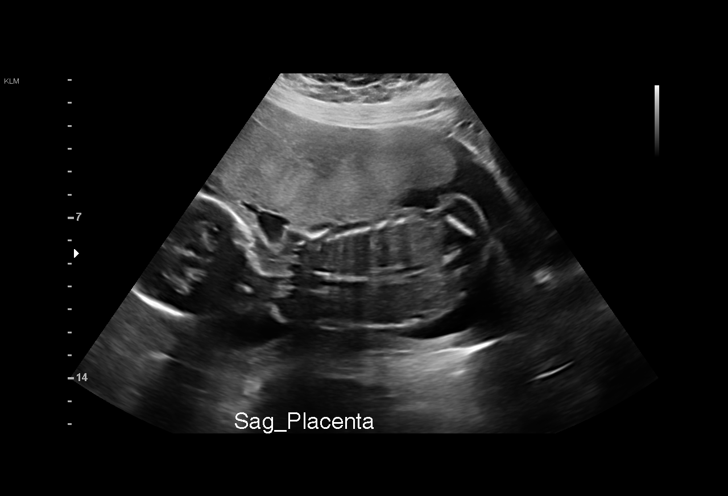
[im 65/65]
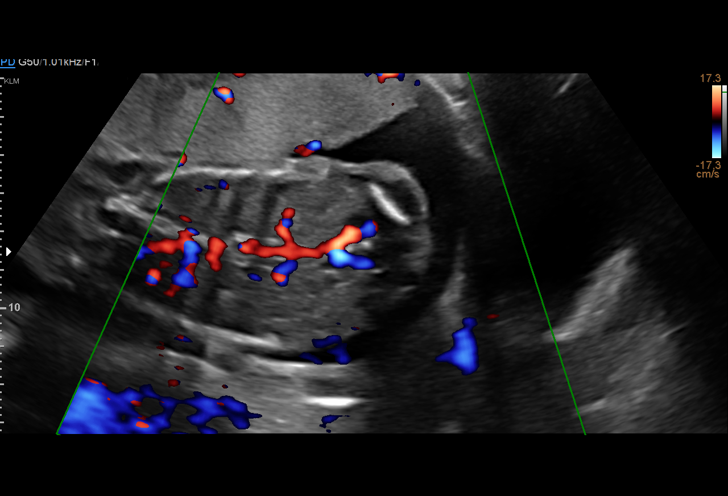

[14 of 28 positions shown; findings below may reference images not displayed]

[REDACTED]
 Referred By:      [facility] MAU/Triage         [facility]

Indications

 Late prenatal care, second trimester
 22 weeks gestation of pregnancy
 Encounter for uncertain dates
 Pelvic pain affecting pregnancy in second
 trimester
Fetal Evaluation

 Num Of Fetuses:         1
 Fetal Heart Rate(bpm):  150
 Cardiac Activity:       Observed
 Presentation:           Variable
 Placenta:               Anterior
 P. Cord Insertion:      Visualized

 Amniotic Fluid
 AFI FV:      Within normal limits
Biometry

 BPD:      52.8  mm     G. Age:  22w 0d         49  %    CI:        70.55   %    70 - 86
                                                         FL/HC:      17.6   %    18.4 -
 HC:      200.4  mm     G. Age:  22w 1d         47  %    HC/AC:      1.13        1.06 -
 AC:      177.4  mm     G. Age:  22w 4d         63  %    FL/BPD:     66.9   %    71 - 87
 FL:       35.3  mm     G. Age:  21w 1d         15  %    FL/AC:      19.9   %    20 - 24
 HUM:      36.5  mm     G. Age:  22w 5d         67  %
 CER:      23.2  mm     G. Age:  21w 4d         55  %

 Est. FW:     467  gm           1 lb     43  %
OB History
 Gravidity:    5         Term:   4        Prem:   0        SAB:   0
 TOP:          0       Ectopic:  0        Living: 4
Gestational Age

 U/S Today:     22w 0d                                        EDD:   12/08/20
 Best:          22w 0d     Det. By:  U/S (08/04/20)           EDD:   12/08/20
Anatomy

 Cranium:               Appears normal         Aortic Arch:            Not well visualized
 Cavum:                 Appears normal         Ductal Arch:            Not well visualized
 Ventricles:            Appears normal         Diaphragm:              Appears normal
 Choroid Plexus:        Appears normal         Stomach:                Appears normal, left
                                                                       sided
 Cerebellum:            Appears normal         Abdomen:                Appears normal
 Posterior Fossa:       Appears normal         Abdominal Wall:         Not well visualized
 Nuchal Fold:           Not applicable (>20    Cord Vessels:           Appears normal (3
                        wks GA)                                        vessel cord)
 Face:                  Orbits nl; profile not Kidneys:                Appear normal
                        well visualized
 Lips:                  Appears normal         Bladder:                Appears normal
 Thoracic:              Appears normal         Spine:                  Appears normal
 Heart:                 Appears normal         Upper Extremities:      Appears normal
                        (4CH, axis, and
                        situs)
 RVOT:                  Not well visualized    Lower Extremities:      Appears normal
 LVOT:                  Not well visualized

 Other:  Fetus appears to be a male. Heels visualized. Nasal bone visualized.
         Technically difficult due to fetal position.
Cervix Uterus Adnexa

 Cervix
 Length:            3.9  cm.
 Normal appearance by transabdominal scan.

 Adnexa
 No abnormality visualized.
Impression

 Single intrauterine pregnancy here for a detailed anatomy
 due pelvic pain and late to prenatal care.
 Normal anatomy with measurements consistent with today's
 ultrasound.
 There is good fetal movement and amniotic fluid volume
 Suboptimal views of the fetal anatomy were obtained
 secondary to fetal position.
Recommendations

 Consider follow up growth in 4-6 weeks.

## 2021-12-02 DEATH — deceased
